# Patient Record
Sex: Male | Born: 1960 | Race: White | Hispanic: No | Smoking: Current every day smoker
Health system: Southern US, Community
[De-identification: ages and names within clinical notes are randomized; demographics above are authoritative.]

## PROBLEM LIST (undated history)

## (undated) HISTORY — PX: APPENDECTOMY: SHX54

---

## 2001-02-07 ENCOUNTER — Emergency Department (HOSPITAL_COMMUNITY): Admission: EM | Admit: 2001-02-07 | Discharge: 2001-02-07 | Payer: Self-pay | Admitting: Emergency Medicine

## 2001-02-07 ENCOUNTER — Encounter: Payer: Self-pay | Admitting: Emergency Medicine

## 2002-04-07 ENCOUNTER — Emergency Department (HOSPITAL_COMMUNITY): Admission: EM | Admit: 2002-04-07 | Discharge: 2002-04-07 | Payer: Self-pay | Admitting: Emergency Medicine

## 2003-07-30 ENCOUNTER — Emergency Department (HOSPITAL_COMMUNITY): Admission: EM | Admit: 2003-07-30 | Discharge: 2003-07-30 | Payer: Self-pay | Admitting: *Deleted

## 2003-10-27 ENCOUNTER — Emergency Department (HOSPITAL_COMMUNITY): Admission: EM | Admit: 2003-10-27 | Discharge: 2003-10-27 | Payer: Self-pay | Admitting: Emergency Medicine

## 2005-04-07 ENCOUNTER — Emergency Department (HOSPITAL_COMMUNITY): Admission: EM | Admit: 2005-04-07 | Discharge: 2005-04-07 | Payer: Self-pay | Admitting: *Deleted

## 2005-07-19 IMAGING — CR DG CHEST 2V
2 series · 2 of 2 positions shown · non-contrast
Comparison: none

CLINICAL DATA: Fell, chest pain.  
 TWO VIEW CHEST
 The heart size and mediastinal contours are unremarkable.  The lungs are clear.  The visualized skeleton is unremarkable.  
 IMPRESSION
 No active disease.

[view not recorded (1 of 2)]
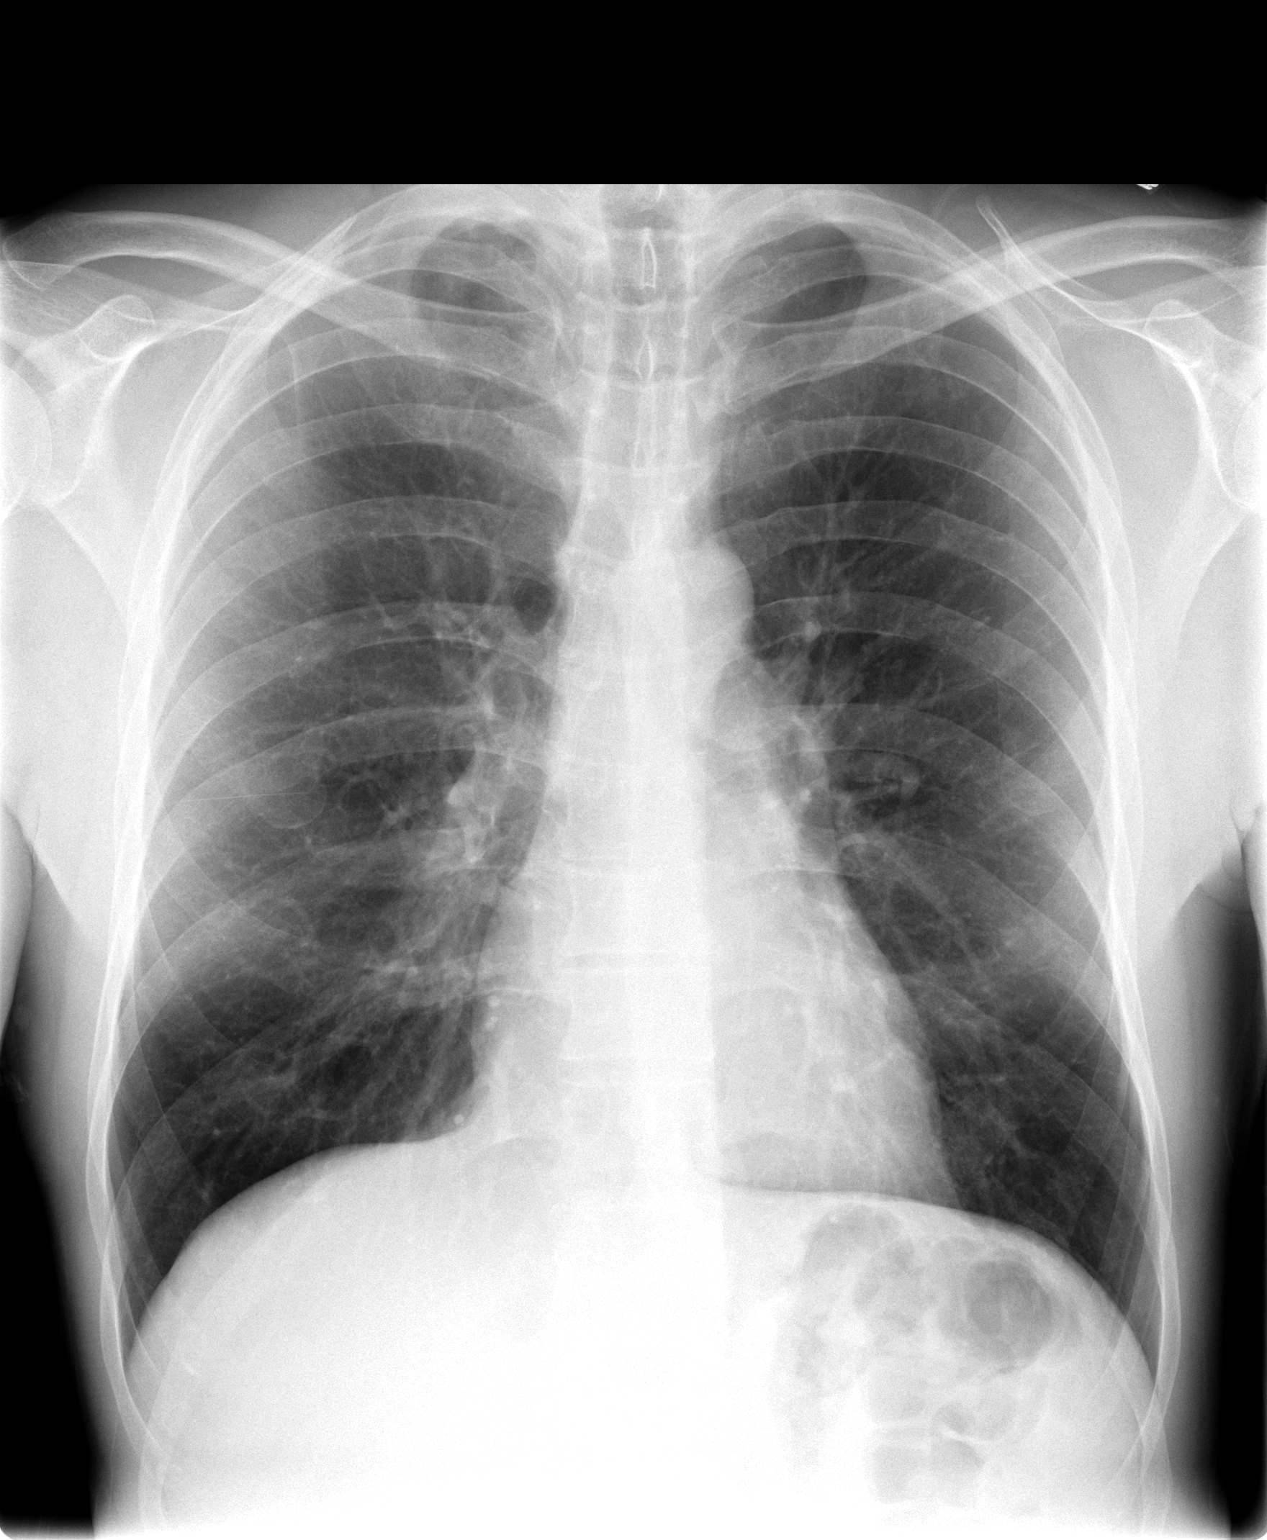

[view not recorded (2 of 2)]
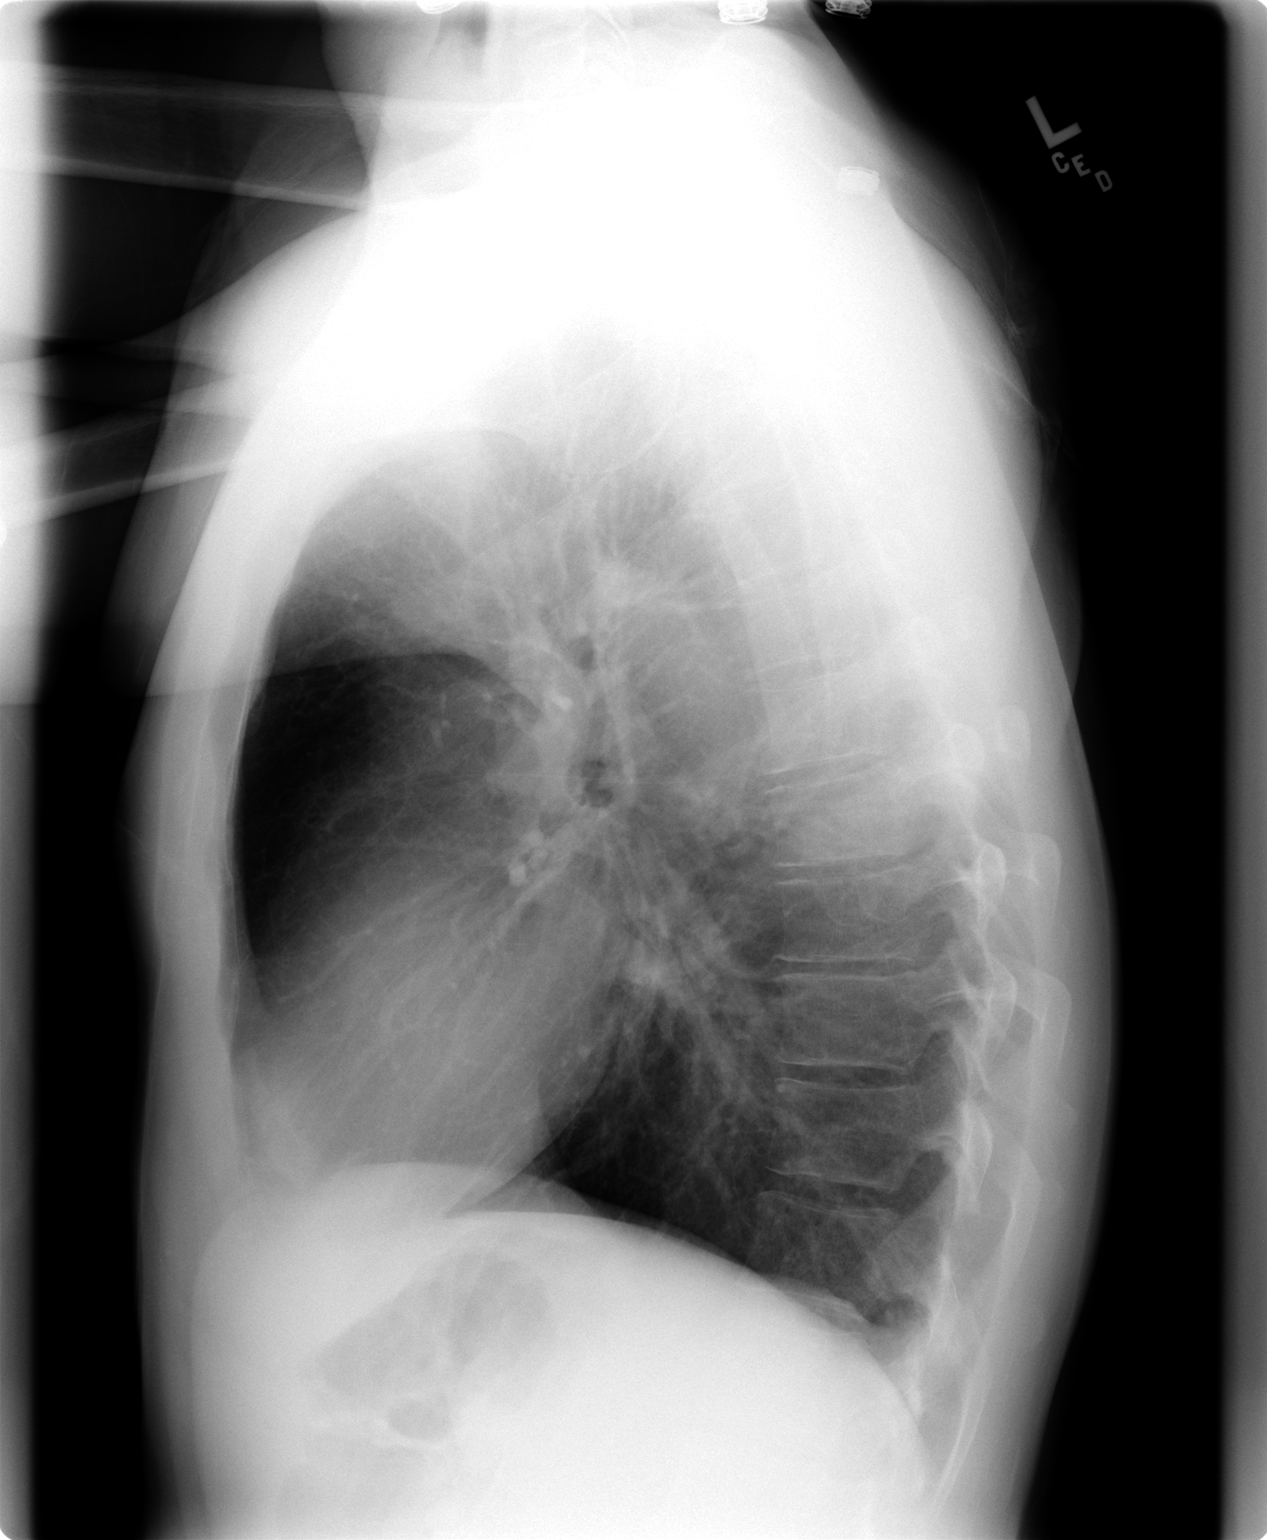

[2 of 2 positions shown; findings below may reference images not displayed]

## 2005-07-30 ENCOUNTER — Emergency Department (HOSPITAL_COMMUNITY): Admission: EM | Admit: 2005-07-30 | Discharge: 2005-07-30 | Payer: Self-pay | Admitting: Emergency Medicine

## 2006-02-24 ENCOUNTER — Emergency Department (HOSPITAL_COMMUNITY): Admission: EM | Admit: 2006-02-24 | Discharge: 2006-02-24 | Payer: Self-pay | Admitting: Emergency Medicine

## 2006-02-27 ENCOUNTER — Emergency Department (HOSPITAL_COMMUNITY): Admission: EM | Admit: 2006-02-27 | Discharge: 2006-02-27 | Payer: Self-pay | Admitting: Emergency Medicine

## 2007-08-17 ENCOUNTER — Encounter (INDEPENDENT_AMBULATORY_CARE_PROVIDER_SITE_OTHER): Payer: Self-pay | Admitting: Otolaryngology

## 2007-08-17 ENCOUNTER — Ambulatory Visit (HOSPITAL_COMMUNITY): Admission: EM | Admit: 2007-08-17 | Discharge: 2007-08-17 | Payer: Self-pay | Admitting: Emergency Medicine

## 2007-12-20 ENCOUNTER — Emergency Department (HOSPITAL_COMMUNITY): Admission: EM | Admit: 2007-12-20 | Discharge: 2007-12-20 | Payer: Self-pay | Admitting: Emergency Medicine

## 2009-09-15 ENCOUNTER — Emergency Department (HOSPITAL_COMMUNITY): Admission: EM | Admit: 2009-09-15 | Discharge: 2009-09-15 | Payer: Self-pay | Admitting: Emergency Medicine

## 2010-06-25 NOTE — Op Note (Signed)
[name], [name]                ACCOUNT NO.:  [id number]   MEDICAL RECORD NO.:  [id number]          PATIENT TYPE:  OBV   LOCATION:  1830                         FACILITY:  [facility]   PHYSICIAN:  Kristine Garbe. Ezzard Standing, M.D.DATE OF BIRTH:  1960-09-26   DATE OF PROCEDURE:  DATE OF DISCHARGE:                               OPERATIVE REPORT   PREOPERATIVE DIAGNOSIS:  Recurrent right peritonsillar abscess.   POSTOPERATIVE DIAGNOSIS:  Recurrent right peritonsillar abscess.   OPERATION:  Tonsillectomy.   SURGEON:  Drema Halon, MD   ANESTHESIA:  General endotracheal.   COMPLICATIONS:  None.   BRIEF CLINICAL NOTE:  Elijah Pena is a 50 year old gentleman who has  had history of tonsil infections in the past.  He has had previous  incision and drainage of the peritonsillar abscess about 2 years ago,  has redeveloped a right peritonsillar abscess.  He has elected to  undergo acute tonsillectomy as this was been planned previously.  He is  taken to operating room this time for tonsillectomy, because of  recurrent right peritonsillar abscess.   DESCRIPTION OF PROCEDURE:  After adequate endotracheal anesthesia, the  patient received 3 grams of Unasyn IV preoperatively.  He also received  15 mg of Decadron.  A mouth gag was used to expose the oropharynx.  The  left tonsil appeared benign.  This was resected from tonsillar fossa  using cautery.  Next, the right tonsil, which was very inflamed with a  right peritonsillar abscess was incised in the right peritonsillar area  and pus was obtained.  The tonsil was then excised from the tonsillar  fossa, exposing the abscess cavity.  Tonsil was sent as a separate  specimen.  Hemostasis was obtained with cautery.  The wound was  irrigated with saline.  After obtaining adequate hemostasis, the  procedure was completed.  There was minimal blood loss.  Elijah Pena was  awoken from anesthesia and transferred to recovery room, postop doing  well.   DISPOSITION:  [name] is discharged home later this morning on amoxicillin  suspension 500 mg b.i.d. for 1 week, Tylenol Lortab elixir 1-1-1/2  tablespoons q.4 h. p.r.n. pain.  We will have him follow up in my office  in 10-14 days for recheck.           ______________________________  Kristine Garbe. Ezzard Standing, M.D.     CEN/MEDQ  D:  08/17/2007  T:  08/18/2007  Job:  [id number]

## 2010-11-07 LAB — CBC
HCT: 40.1
Hemoglobin: 13.8
MCHC: 34.4
RDW: 12.4

## 2010-11-07 LAB — DIFFERENTIAL
Basophils Absolute: 0.1
Basophils Relative: 1
Eosinophils Relative: 3
Monocytes Absolute: 0.9

## 2010-11-07 LAB — BASIC METABOLIC PANEL
BUN: 6
Calcium: 8.6
Chloride: 104
Creatinine, Ser: 0.92
GFR calc Af Amer: >60
GFR calc non Af Amer: >60

## 2010-11-07 LAB — RAPID STREP SCREEN (MED CTR MEBANE ONLY): Streptococcus, Group A Screen (Direct): NEGATIVE

## 2010-11-12 LAB — DIFFERENTIAL
Basophils Absolute: 0
Basophils Relative: 0
Eosinophils Absolute: 0
Neutro Abs: 14.1 — ABNORMAL HIGH
Neutrophils Relative %: 89 — ABNORMAL HIGH

## 2010-11-12 LAB — URINALYSIS, ROUTINE W REFLEX MICROSCOPIC
Hgb urine dipstick: NEGATIVE
Nitrite: NEGATIVE
Protein, ur: NEGATIVE
Specific Gravity, Urine: 1.021
Urobilinogen, UA: 1

## 2010-11-12 LAB — POCT CARDIAC MARKERS
CKMB, poc: <1 — ABNORMAL LOW
Myoglobin, poc: 52

## 2010-11-12 LAB — CBC
Platelets: 218
RDW: 12.9

## 2010-11-12 LAB — COMPREHENSIVE METABOLIC PANEL
ALT: 25
Alkaline Phosphatase: 69
BUN: 9
CO2: 24
GFR calc non Af Amer: >60
Glucose, Bld: 98
Potassium: 4.7
Sodium: 130 — ABNORMAL LOW
Total Bilirubin: 0.8
Total Protein: 6.4

## 2011-08-29 ENCOUNTER — Other Ambulatory Visit: Payer: Self-pay | Admitting: Dermatology

## 2011-12-16 ENCOUNTER — Other Ambulatory Visit: Payer: Self-pay | Admitting: Dermatology

## 2012-01-02 ENCOUNTER — Other Ambulatory Visit: Payer: Self-pay | Admitting: Dermatology

## 2017-03-03 ENCOUNTER — Encounter: Payer: Self-pay | Admitting: Urgent Care

## 2017-03-03 ENCOUNTER — Ambulatory Visit: Payer: 59 | Admitting: Urgent Care

## 2017-03-03 VITALS — BP 125/72 | HR 77 | Temp 98.1°F | Resp 18 | Ht 66.5 in | Wt 132.0 lb

## 2017-03-03 DIAGNOSIS — H6123 Impacted cerumen, bilateral: Secondary | ICD-10-CM | POA: Diagnosis not present

## 2017-03-03 DIAGNOSIS — F17200 Nicotine dependence, unspecified, uncomplicated: Secondary | ICD-10-CM | POA: Diagnosis not present

## 2017-03-03 MED ORDER — NICOTINE POLACRILEX 2 MG MT GUM: CHEWING_GUM | OROMUCOSAL | 5 refills | Status: DC

## 2017-03-03 NOTE — Patient Instructions (Addendum)
Earwax Buildup, Adult The ears produce a substance called earwax that helps keep bacteria out of the ear and protects the skin in the ear canal. Occasionally, earwax can build up in the ear and cause discomfort or hearing loss. What increases the risk? This condition is more likely to develop in people who:  Are male.  Are elderly.  Naturally produce more earwax.  Clean their ears often with cotton swabs.  Use earplugs often.  Use in-ear headphones often.  Wear hearing aids.  Have narrow ear canals.  Have earwax that is overly thick or sticky.  Have eczema.  Are dehydrated.  Have excess hair in the ear canal.  What are the signs or symptoms? Symptoms of this condition include:  Reduced or muffled hearing.  A feeling of fullness in the ear or feeling that the ear is plugged.  Fluid coming from the ear.  Ear pain.  Ear itch.  Ringing in the ear.  Coughing.  An obvious piece of earwax that can be seen inside the ear canal.  How is this diagnosed? This condition may be diagnosed based on:  Your symptoms.  Your medical history.  An ear exam. During the exam, your health care provider will look into your ear with an instrument called an otoscope.  You may have tests, including a hearing test. How is this treated? This condition may be treated by:  Using ear drops to soften the earwax.  Having the earwax removed by a health care provider. The health care provider may: ? Flush the ear with water. ? Use an instrument that has a loop on the end (curette). ? Use a suction device.  Surgery to remove the wax buildup. This may be done in severe cases.  Follow these instructions at home:  Take over-the-counter and prescription medicines only as told by your health care provider.  Do not put any objects, including cotton swabs, into your ear. You can clean the opening of your ear canal with a washcloth or facial tissue.  Follow instructions from your health  care provider about cleaning your ears. Do not over-clean your ears.  Drink enough fluid to keep your urine clear or pale yellow. This will help to thin the earwax.  Keep all follow-up visits as told by your health care provider. If earwax builds up in your ears often or if you use hearing aids, consider seeing your health care provider for routine, preventive ear cleanings. Ask your health care provider how often you should schedule your cleanings.  If you have hearing aids, clean them according to instructions from the manufacturer and your health care provider. Contact a health care provider if:  You have ear pain.  You develop a fever.  You have blood, pus, or other fluid coming from your ear.  You have hearing loss.  You have ringing in your ears that does not go away.  Your symptoms do not improve with treatment.  You feel like the room is spinning (vertigo). Summary  Earwax can build up in the ear and cause discomfort or hearing loss.  The most common symptoms of this condition include reduced or muffled hearing and a feeling of fullness in the ear or feeling that the ear is plugged.  This condition may be diagnosed based on your symptoms, your medical history, and an ear exam.  This condition may be treated by using ear drops to soften the earwax or by having the earwax removed by a health care provider.  Do   not put any objects, including cotton swabs, into your ear. You can clean the opening of your ear canal with a washcloth or facial tissue. This information is not intended to replace advice given to you by your health care provider. Make sure you discuss any questions you have with your health care provider. Document Released: 03/06/2004 Document Revised: 04/09/2016 Document Reviewed: 04/09/2016 Elsevier Interactive Patient Education  2018 [employer].     Use up to 24 pieces of gum per day for six weeks. Gradually reduce use over a second six weeks, for a total  duration of three months.     Nicotine chewing gum What is this medicine? NICOTINE (NIK [place] teen) helps people stop smoking. This medicine replaces the nicotine found in cigarettes and helps to decrease withdrawal effects. It is most effective when used in combination with a stop-smoking program. This medicine may be used for other purposes; ask your health care provider or pharmacist if you have questions. COMMON BRAND NAME(S): NICOrelief, Nicorette What should I tell my health care provider before I take this medicine? They need to know if you have any of these conditions: -diabetes -heart disease, angina, irregular heartbeat or previous heart attack -high blood pressure -lung disease, including asthma -overactive thyroid -pheochromocytoma -seizures or history of seizures -stomach problems or ulcers -an unusual or allergic reaction to nicotine, other medicines, foods, dyes, or preservatives -pregnant or trying to get pregnant -breast-feeding How should I use this medicine? Chew but do not swallow the gum. Follow the directions that come with the chewing gum. Use exactly as directed. When you feel an urgent desire for a cigarette, chew one piece of gum slowly. Continue chewing until you taste the gum or feel a slight tingling in your mouth. Then, stop chewing and place the gum between your cheek and gum. Wait until the taste or tingling is almost gone then start chewing again. Continue chewing in this manner for about 30 minutes. Slow chewing helps reduce cravings and also helps reduce the chance for heartburn or other gastrointestinal side effects. Talk to your pediatrician regarding the use of this medicine in children. Special care may be needed. Overdosage: If you think you have taken too much of this medicine contact a poison control center or emergency room at once. NOTE: This medicine is only for you. Do not share this medicine with others. What if I miss a dose? This does not  apply. Only use the chewing gum when you have a strong desire to smoke. Do not use more than one piece of gum at a time. What may interact with this medicine? -medicines for asthma -medicines for blood pressure -medicines for mental depression This list may not describe all possible interactions. Give your health care provider a list of all the medicines, herbs, non-prescription drugs, or dietary supplements you use. Also tell them if you smoke, drink alcohol, or use illegal drugs. Some items may interact with your medicine. What should I watch for while using this medicine? Always carry the nicotine gum with you. Do not use more than 30 pieces of gum a day. Too much gum can increase the risk of an overdose. As the urge to smoke gets less, gradually reduce the number of pieces each day over a period of 2 to 3 months. When you are only using 1 or 2 pieces a day, stop using the nicotine gum. You should begin using the nicotine gum the day you stop smoking. It is okay if you do not  succeed with the attempt to quit and have a cigarette. You can still continue your quit attempt and keep using the product as directed. Just throw away your cigarettes and get back to your quit plan. If your mouth gets sore from chewing the gum, suck hard sugarless candy between pieces of gum to help relieve the soreness. Brush your teeth regularly to reduce mouth irritation. If you wear dentures, contact your doctor or health care professional if the gum sticks to your dental work. If you are a diabetic and you quit smoking, the effects of insulin may be increased and you may need to reduce your insulin dose. Check with your doctor or health care professional about how you should adjust your insulin dose. What side effects may I notice from receiving this medicine? Side effects that you should report to your doctor or health care professional as soon as possible: -allergic reactions like skin rash, itching or hives, swelling of  the face, lips, or tongue -blisters in mouth -breathing problems -changes in hearing -changes in vision -chest pain -cold sweats -confusion -fast, irregular heartbeat -feeling faint or lightheaded, falls -headache -increased saliva -nausea, vomiting -stomach pain -weakness Side effects that usually do not require medical attention (report to your doctor or health care professional if they continue or are bothersome): -diarrhea -dry mouth -hiccups -irritability -nervousness or restlessness -trouble sleeping or vivid dreams This list may not describe all possible side effects. Call your doctor for medical advice about side effects. You may report side effects to FDA at 1-800-FDA-1088. Where should I keep my medicine? Keep out of the reach of children. Store at room temperature between 15 and 30 degrees C (59 and 86 degrees F). Protect from heat and light. Throw away unused medicine after the expiration date. NOTE: This sheet is a summary. It may not cover all possible information. If you have questions about this medicine, talk to your doctor, pharmacist, or health care provider.  2018 Elsevier/Gold Standard (2014-07-24 19:37:14)     Steps to Quit Smoking Smoking tobacco can be harmful to your health and can affect almost every organ in your body. Smoking puts you, and those around you, at risk for developing many serious chronic diseases. Quitting smoking is difficult, but it is one of the best things that you can do for your health. It is never too late to quit. What are the benefits of quitting smoking? When you quit smoking, you lower your risk of developing serious diseases and conditions, such as:  Lung cancer or lung disease, such as COPD.  Heart disease.  Stroke.  Heart attack.  Infertility.  Osteoporosis and bone fractures.  Additionally, symptoms such as coughing, wheezing, and shortness of breath may get better when you quit. You may also find that you get  sick less often because your body is stronger at fighting off colds and infections. If you are pregnant, quitting smoking can help to reduce your chances of having a baby of low birth weight. How do I get ready to quit? When you decide to quit smoking, create a plan to make sure that you are successful. Before you quit:  Pick a date to quit. Set a date within the next two weeks to give you time to prepare.  Write down the reasons why you are quitting. Keep this list in places where you will see it often, such as on your bathroom mirror or in your car or wallet.  Identify the people, places, things, and activities that make  you want to smoke (triggers) and avoid them. Make sure to take these actions: ? Throw away all cigarettes at home, at work, and in your car. ? Throw away smoking accessories, such as Set designer. ? Clean your car and make sure to empty the ashtray. ? Clean your home, including curtains and carpets.  Tell your family, friends, and coworkers that you are quitting. Support from your loved ones can make quitting easier.  Talk with your health care provider about your options for quitting smoking.  Find out what treatment options are covered by your health insurance.  What strategies can I use to quit smoking? Talk with your healthcare provider about different strategies to quit smoking. Some strategies include:  Quitting smoking altogether instead of gradually lessening how much you smoke over a period of time. Research shows that quitting "cold [place]" is more successful than gradually quitting.  Attending in-person counseling to help you build problem-solving skills. You are more likely to have success in quitting if you attend several counseling sessions. Even short sessions of 10 minutes can be effective.  Finding resources and support systems that can help you to quit smoking and remain smoke-free after you quit. These resources are most helpful when you use  them often. They can include: ? Online chats with a Veterinary surgeon. ? Telephone quitlines. ? Automotive engineer. ? Support groups or group counseling. ? Text messaging programs. ? Mobile phone applications.  Taking medicines to help you quit smoking. (If you are pregnant or breastfeeding, talk with your health care provider first.) Some medicines contain nicotine and some do not. Both types of medicines help with cravings, but the medicines that include nicotine help to relieve withdrawal symptoms. Your health care provider may recommend: ? Nicotine patches, gum, or lozenges. ? Nicotine inhalers or sprays. ? Non-nicotine medicine that is taken by mouth.  Talk with your health care provider about combining strategies, such as taking medicines while you are also receiving in-person counseling. Using these two strategies together makes you more likely to succeed in quitting than if you used either strategy on its own. If you are pregnant or breastfeeding, talk with your health care provider about finding counseling or other support strategies to quit smoking. Do not take medicine to help you quit smoking unless told to do so by your health care provider. What things can I do to make it easier to quit? Quitting smoking might feel overwhelming at first, but there is a lot that you can do to make it easier. Take these important actions:  Reach out to your family and friends and ask that they support and encourage you during this time. Call telephone quitlines, reach out to support groups, or work with a counselor for support.  Ask people who smoke to avoid smoking around you.  Avoid places that trigger you to smoke, such as bars, parties, or smoke-break areas at work.  Spend time around people who do not smoke.  Lessen stress in your life, because stress can be a smoking trigger for some people. To lessen stress, try: ? Exercising regularly. ? Deep-breathing  exercises. ? Yoga. ? Meditating. ? Performing a body scan. This involves closing your eyes, scanning your body from head to toe, and noticing which parts of your body are particularly tense. Purposefully relax the muscles in those areas.  Download or purchase mobile phone or tablet apps (applications) that can help you stick to your quit plan by providing reminders, tips, and encouragement. There are  many free apps, such as QuitGuide from the [employer] developer for Disease Control and Prevention). You can find other support for quitting smoking (smoking cessation) through smokefree.gov and other websites.  How will I feel when I quit smoking? Within the first 24 hours of quitting smoking, you may start to feel some withdrawal symptoms. These symptoms are usually most noticeable 2-3 days after quitting, but they usually do not last beyond 2-3 weeks. Changes or symptoms that you might experience include:  Mood swings.  Restlessness, anxiety, or irritation.  Difficulty concentrating.  Dizziness.  Strong cravings for sugary foods in addition to nicotine.  Mild weight gain.  Constipation.  Nausea.  Coughing or a sore throat.  Changes in how your medicines work in your body.  A depressed mood.  Difficulty sleeping (insomnia).  After the first 2-3 weeks of quitting, you may start to notice more positive results, such as:  Improved sense of smell and taste.  Decreased coughing and sore throat.  Slower heart rate.  Lower blood pressure.  Clearer skin.  The ability to breathe more easily.  Fewer sick days.  Quitting smoking is very challenging for most people. Do not get discouraged if you are not successful the first time. Some people need to make many attempts to quit before they achieve long-term success. Do your best to stick to your quit plan, and talk with your health care provider if you have any questions or concerns. This information is not intended to replace advice given  to you by your health care provider. Make sure you discuss any questions you have with your health care provider. Document Released: 01/21/2001 Document Revised: 09/25/2015 Document Reviewed: 06/13/2014 Elsevier Interactive Patient Education  2018 [employer].     IF you received an x-ray today, you will receive an invoice from [facility]. Please contact [facility] at [phone] with questions or concerns regarding your invoice.   IF you received labwork today, you will receive an invoice from [facility]. Please contact [facility] at [phone] with questions or concerns regarding your invoice.   Our billing staff will not be able to assist you with questions regarding bills from these companies.  You will be contacted with the lab results as soon as they are available. The fastest way to get your results is to activate your My Chart account. Instructions are located on the last page of this paperwork. If you have not heard from [place] regarding the results in 2 weeks, please contact this office.

## 2017-03-03 NOTE — Progress Notes (Signed)
  MRN: [id number] DOB: July 29, 1960  Subjective:   Elijah ClayDavid E Pena is a 57 y.o. male new to our practice presenting for longstanding decreased hearing and ear wax build up. Also has tinnitus. He was at [facility] today for a hearing screen and advised to set up an office visit for us due to having ear wax build up. Denies fever, ear drainage, ear pain, dizziness. Smokes 1 ppd, has tried to quit before, had an allergic reaction to Wellbutrin and had to be hospitalized. Has also tried patches before but got a rash with this too.   Elijah Pena is not currently taking any medications and is allergic to wellbutrin [bupropion].  Elijah Pena denies past medical history. Also  has a past surgical history that includes Appendectomy. Denies family history of cancer, diabetes, HTN, HL, heart disease, stroke, mental illness.    Objective:   Vitals: BP 125/72   Pulse 77   Temp 98.1 F (36.7 C) (Oral)   Resp 18   Ht 5' 6.5" (1.689 m)   Wt 132 lb (59.9 kg)   SpO2 96%   BMI 20.99 kg/m   Physical Exam  Constitutional: He is oriented to person, place, and time. He appears well-developed and well-nourished.  HENT:  Bilateral cerumen impaction.  Cardiovascular: Normal rate.  Pulmonary/Chest: Effort normal.  Neurological: He is alert and oriented to person, place, and time.  Psychiatric: He has a normal mood and affect.   Assessment and Plan :   Bilateral impacted cerumen - Plan: Ear wax removal  Tobacco use disorder  Ear lavage performed. Smoking cessation discussed. Will start Nicorette gum. Counseled patient on potential for adverse effects with medications prescribed today, patient verbalized understanding. Recheck in 6 weeks.  Wallis BambergMario Tran Randle, PA-C Primary Care at [facility] [phone] 03/03/2017  4:00 PM

## 2017-03-17 ENCOUNTER — Ambulatory Visit: Payer: 59 | Admitting: Urgent Care

## 2017-03-17 ENCOUNTER — Encounter: Payer: Self-pay | Admitting: Urgent Care

## 2017-03-17 VITALS — BP 124/80 | HR 86 | Temp 98.6°F | Resp 17 | Ht 67.5 in | Wt 133.0 lb

## 2017-03-17 DIAGNOSIS — H9193 Unspecified hearing loss, bilateral: Secondary | ICD-10-CM

## 2017-03-17 NOTE — Progress Notes (Signed)
  MRN: [id number] DOB: 05/01/1960  Subjective:   Donata ClayDavid E Ashe is a 57 y.o. male presenting for a prescription for hearing aids. Patient was seen by [facility], had audiometry testing and was found to have hearing loss, left worse than right. He was not able to get his hearing aids given that his insurance provider denied the claim as he needs a script from an ENT.   Onalee HuaDavid has a current medication list which includes the following prescription(s): nicotine polacrilex. Also is allergic to wellbutrin [bupropion] and nicoderm [nicotine].  Onalee HuaDavid  has no past medical history on file. Also  has a past surgical history that includes Appendectomy.  Objective:   Vitals: BP 124/80   Pulse 86   Temp 98.6 F (37 C) (Oral)   Resp 17   Ht 5' 7.5" (1.715 m)   Wt 133 lb (60.3 kg)   SpO2 98%   BMI 20.52 kg/m   Physical Exam  Constitutional: He is oriented to person, place, and time. He appears well-developed and well-nourished.  Cardiovascular: Normal rate.  Pulmonary/Chest: Effort normal.  Neurological: He is alert and oriented to person, place, and time.  Psychiatric: He has a normal mood and affect.   Assessment and Plan :   Will refer for the soonest available appointment with an ENT physician not advanced practice practitioner. Patient needs a signed script from a specialist for a hearing aid for insurance approval, has already had his audiometry testing.  Wallis BambergMario Jehad Bisono, PA-C Primary Care at [facility] [phone] 03/17/2017  3:48 PM

## 2017-03-17 NOTE — Patient Instructions (Addendum)
Hearing Loss Hearing loss is a partial or total loss of the ability to hear. This can be temporary or permanent, and it can happen in one or both ears. Hearing loss may be referred to as deafness. Medical care is necessary to treat hearing loss properly and to prevent the condition from getting worse. Your hearing may partially or completely come back, depending on what caused your hearing loss and how severe it is. In some cases, hearing loss is permanent. What are the causes? Common causes of hearing loss include:  Too much wax in the ear canal.  Infection of the ear canal or middle ear.  Fluid in the middle ear.  Injury to the ear or surrounding area.  An object stuck in the ear.  Prolonged exposure to loud sounds, such as music.  Less common causes of hearing loss include:  Tumors in the ear.  Viral or bacterial infections, such as meningitis.  A hole in the eardrum (perforated eardrum).  Problems with the hearing nerve that sends signals between the brain and the ear.  Certain medicines.  What are the signs or symptoms? Symptoms of this condition may include:  Difficulty telling the difference between sounds.  Difficulty following a conversation when there is background noise.  Lack of response to sounds in your environment. This may be most noticeable when you do not respond to startling sounds.  Needing to turn up the volume on the television, radio, etc.  Ringing in the ears.  Dizziness.  Pain in the ears.  How is this diagnosed? This condition is diagnosed based on a physical exam and a hearing test (audiometry). The audiometry test will be performed by a hearing specialist (audiologist). You may also be referred to an ear, nose, and throat (ENT) specialist (otolaryngologist). How is this treated? Treatment for recent onset of hearing loss may include:  Ear wax removal.  Being prescribed medicines to prevent infection (antibiotics).  Being prescribed  medicines to reduce inflammation (corticosteroids).  Follow these instructions at home:  If you were prescribed an antibiotic medicine, take it as told by your health care provider. Do not stop taking the antibiotic even if you start to feel better.  Take over-the-counter and prescription medicines only as told by your health care provider.  Avoid loud noises.  Return to your normal activities as told by your health care provider. Ask your health care provider what activities are safe for you.  Keep all follow-up visits as told by your health care provider. This is important. Contact a health care provider if:  You feel dizzy.  You develop new symptoms.  You vomit or feel nauseous.  You have a fever. Get help right away if:  You develop sudden changes in your vision.  You have severe ear pain.  You have new or increased weakness.  You have a severe headache. This information is not intended to replace advice given to you by your health care provider. Make sure you discuss any questions you have with your health care provider. Document Released: 01/27/2005 Document Revised: 07/05/2015 Document Reviewed: 06/14/2014 Elsevier Interactive Patient Education  2018 [employer].     IF you received an x-ray today, you will receive an invoice from [facility]. Please contact [facility] at [phone] with questions or concerns regarding your invoice.   IF you received labwork today, you will receive an invoice from [facility]. Please contact [facility] at [phone] with questions or concerns regarding your invoice.   Our billing staff will  not be able to assist you with questions regarding bills from these companies.  You will be contacted with the lab results as soon as they are available. The fastest way to get your results is to activate your My Chart account. Instructions are located on the last page of this paperwork. If you have not heard from us regarding  the results in 2 weeks, please contact this office.

## 2017-04-02 DIAGNOSIS — H903 Sensorineural hearing loss, bilateral: Secondary | ICD-10-CM | POA: Diagnosis not present

## 2017-05-07 DIAGNOSIS — H918X2 Other specified hearing loss, left ear: Secondary | ICD-10-CM | POA: Diagnosis not present

## 2017-05-07 DIAGNOSIS — H9313 Tinnitus, bilateral: Secondary | ICD-10-CM | POA: Diagnosis not present

## 2017-05-07 DIAGNOSIS — H903 Sensorineural hearing loss, bilateral: Secondary | ICD-10-CM | POA: Diagnosis not present

## 2018-02-08 DIAGNOSIS — H40023 Open angle with borderline findings, high risk, bilateral: Secondary | ICD-10-CM | POA: Diagnosis not present

## 2018-02-08 DIAGNOSIS — H25013 Cortical age-related cataract, bilateral: Secondary | ICD-10-CM | POA: Diagnosis not present

## 2018-02-08 DIAGNOSIS — H40033 Anatomical narrow angle, bilateral: Secondary | ICD-10-CM | POA: Diagnosis not present

## 2022-12-23 ENCOUNTER — Other Ambulatory Visit: Payer: Self-pay

## 2022-12-23 ENCOUNTER — Emergency Department (HOSPITAL_COMMUNITY)
Admission: EM | Admit: 2022-12-23 | Discharge: 2022-12-23 | Disposition: A | Discharge status: Home / Self Care | Payer: BLUE CROSS/BLUE SHIELD | Attending: Emergency Medicine | Admitting: Emergency Medicine

## 2022-12-23 ENCOUNTER — Emergency Department (HOSPITAL_COMMUNITY): Payer: BLUE CROSS/BLUE SHIELD

## 2022-12-23 ENCOUNTER — Encounter (HOSPITAL_COMMUNITY): Payer: Self-pay | Admitting: Emergency Medicine

## 2022-12-23 DIAGNOSIS — Z1152 Encounter for screening for COVID-19: Secondary | ICD-10-CM | POA: Insufficient documentation

## 2022-12-23 DIAGNOSIS — F17210 Nicotine dependence, cigarettes, uncomplicated: Secondary | ICD-10-CM | POA: Diagnosis not present

## 2022-12-23 DIAGNOSIS — J9801 Acute bronchospasm: Secondary | ICD-10-CM | POA: Insufficient documentation

## 2022-12-23 DIAGNOSIS — D72829 Elevated white blood cell count, unspecified: Secondary | ICD-10-CM | POA: Diagnosis not present

## 2022-12-23 DIAGNOSIS — R0602 Shortness of breath: Secondary | ICD-10-CM | POA: Diagnosis present

## 2022-12-23 LAB — CBC WITH DIFFERENTIAL/PLATELET
Abs Immature Granulocytes: 0.05 10*3/uL (ref 0.00–0.07)
Basophils Absolute: 0 10*3/uL (ref 0.0–0.1)
Basophils Relative: 0 %
Eosinophils Absolute: 0.3 10*3/uL (ref 0.0–0.5)
Eosinophils Relative: 3 %
HCT: 38.8 % — ABNORMAL LOW (ref 39.0–52.0)
Hemoglobin: 13.1 g/dL (ref 13.0–17.0)
Immature Granulocytes: 0 %
Lymphocytes Relative: 10 %
Lymphs Abs: 1.1 10*3/uL (ref 0.7–4.0)
MCH: 29.6 pg (ref 26.0–34.0)
MCHC: 33.8 g/dL (ref 30.0–36.0)
MCV: 87.8 fL (ref 80.0–100.0)
Monocytes Absolute: 0.7 10*3/uL (ref 0.1–1.0)
Monocytes Relative: 6 %
Neutro Abs: 9.4 10*3/uL — ABNORMAL HIGH (ref 1.7–7.7)
Neutrophils Relative %: 81 %
Platelets: 306 10*3/uL (ref 150–400)
RBC: 4.42 MIL/uL (ref 4.22–5.81)
RDW: 11.9 % (ref 11.5–15.5)
WBC: 11.6 10*3/uL — ABNORMAL HIGH (ref 4.0–10.5)
nRBC: 0 % (ref 0.0–0.2)

## 2022-12-23 LAB — BASIC METABOLIC PANEL
Anion gap: 8 (ref 5–15)
BUN: 10 mg/dL (ref 8–23)
CO2: 24 mmol/L (ref 22–32)
Calcium: 9.3 mg/dL (ref 8.9–10.3)
Chloride: 104 mmol/L (ref 98–111)
Creatinine, Ser: 0.92 mg/dL (ref 0.61–1.24)
GFR, Estimated: >60 mL/min (ref >60)
Glucose, Bld: 134 mg/dL — ABNORMAL HIGH (ref 70–99)
Potassium: 4.5 mmol/L (ref 3.5–5.1)
Sodium: 136 mmol/L (ref 135–145)

## 2022-12-23 LAB — RESP PANEL BY RT-PCR (RSV, FLU A&B, COVID)  RVPGX2
Influenza A by PCR: NEGATIVE
Influenza B by PCR: NEGATIVE
Resp Syncytial Virus by PCR: NEGATIVE
SARS Coronavirus 2 by RT PCR: NEGATIVE

## 2022-12-23 LAB — TROPONIN I (HIGH SENSITIVITY): Troponin I (High Sensitivity): 5 ng/L (ref <18)

## 2022-12-23 MED ORDER — IPRATROPIUM-ALBUTEROL 0.5-2.5 (3) MG/3ML IN SOLN
3.0000 mL | Freq: Once | RESPIRATORY_TRACT | Status: DC
  Filled 2022-12-23: qty 3

## 2022-12-23 MED ORDER — ALBUTEROL SULFATE (2.5 MG/3ML) 0.083% IN NEBU
10.0000 mg/h | INHALATION_SOLUTION | RESPIRATORY_TRACT | Status: AC
  Administered 2022-12-23: 10 mg/h via RESPIRATORY_TRACT
  Filled 2022-12-23: qty 12

## 2022-12-23 MED ORDER — PREDNISONE 20 MG PO TABS: 40.0000 mg | ORAL_TABLET | Freq: Every day | ORAL | 0 refills | Status: DC

## 2022-12-23 MED ORDER — IPRATROPIUM-ALBUTEROL 0.5-2.5 (3) MG/3ML IN SOLN
3.0000 mL | Freq: Once | RESPIRATORY_TRACT | Status: AC
  Administered 2022-12-23: 3 mL via RESPIRATORY_TRACT

## 2022-12-23 MED ORDER — NICOTINE 21 MG/24HR TD PT24: 21.0000 mg | MEDICATED_PATCH | Freq: Every day | TRANSDERMAL | 0 refills | Status: DC

## 2022-12-23 MED ORDER — ALBUTEROL SULFATE HFA 108 (90 BASE) MCG/ACT IN AERS: 2.0000 | INHALATION_SPRAY | RESPIRATORY_TRACT | 3 refills | Status: DC | PRN

## 2022-12-23 MED ORDER — ALBUTEROL SULFATE HFA 108 (90 BASE) MCG/ACT IN AERS
2.0000 | INHALATION_SPRAY | RESPIRATORY_TRACT | Status: DC | PRN
  Administered 2022-12-23: 2 via RESPIRATORY_TRACT
  Filled 2022-12-23: qty 6.7

## 2022-12-23 NOTE — ED Triage Notes (Signed)
Pt in with worsening cough, chills and sob x 2 wks. Sats 87% when placed in triage, 2LNC applied. Reporting pain to back and ribs, expiratory wheezes noted in triage

## 2022-12-23 NOTE — Discharge Instructions (Signed)
Use 2 puffs of the inhaler every 4 hours for the next 2 days.  Please follow-up with the clinic listed.  Return for new or worsening symptoms.

## 2022-12-23 NOTE — ED Provider Notes (Signed)
[facility] Emergency Department Provider Note MRN:  [id number]  Arrival date & time: 12/23/22     Chief Complaint   Shortness of Breath and Cough   History of Present Illness   Elijah Pena is a 62 y.o. year-old male presents to the ED with chief complaint of shortness of breath and wheezing.  He states that the symptoms acutely worsened tonight.  He is an everyday smoker.  He denies ever having been diagnosed with COPD or asthma.  He denies any fever or chills.  He states that he has chronic cough.  Denies any successful treatments prior to arrival.  He did try getting an over-the-counter inhaler, that did not help.Marland Kitchen  History provided by patient.   Review of Systems  Pertinent positive and negative review of systems noted in HPI.    Physical Exam   Vitals:   12/23/22 0530 12/23/22 0612  BP: 123/62   Pulse: (!) 106   Resp: 15   Temp:  99.5 F (37.5 C)  SpO2: 92%     CONSTITUTIONAL:  non toxic-appearing, NAD NEURO:  Alert and oriented x 3, CN 3-12 grossly intact EYES:  eyes equal and reactive ENT/NECK:  Supple, no stridor  CARDIO:  normal rate, regular rhythm, appears well-perfused  PULM:  mildly increased WOB, diffuse wheezing GI/GU:  non-distended,  MSK/SPINE:  No gross deformities, no edema, moves all extremities  SKIN:  no rash, atraumatic   *Additional and/or pertinent findings included in MDM below  Diagnostic and Interventional Summary    EKG Interpretation Date/Time:    Ventricular Rate:    PR Interval:    QRS Duration:    QT Interval:    QTC Calculation:   R Axis:      Text Interpretation:         Labs Reviewed  CBC WITH DIFFERENTIAL/PLATELET - Abnormal; Notable for the following components:      Result Value   WBC 11.6 (*)    HCT 38.8 (*)    Neutro Abs 9.4 (*)    All other components within normal limits  BASIC METABOLIC PANEL - Abnormal; Notable for the following components:   Glucose, Bld 134 (*)    All other  components within normal limits  RESP PANEL BY RT-PCR (RSV, FLU A&B, COVID)  [id number]  TROPONIN I (HIGH SENSITIVITY)  TROPONIN I (HIGH SENSITIVITY)    DG Chest Portable 1 View  Final Result      Medications  ipratropium-albuterol (DUONEB) 0.5-2.5 (3) MG/3ML nebulizer solution 3 mL (has no administration in time range)  albuterol (PROVENTIL) (2.5 MG/3ML) 0.083% nebulizer solution (0 mg/hr Nebulization Stopped 12/23/22 0614)  albuterol (VENTOLIN HFA) 108 (90 Base) MCG/ACT inhaler 2 puff (2 puffs Inhalation Given 12/23/22 0613)  ipratropium-albuterol (DUONEB) 0.5-2.5 (3) MG/3ML nebulizer solution 3 mL (3 mLs Nebulization Given 12/23/22 0221)     Procedures  /  Critical Care Procedures  ED Course and Medical Decision Making  I have reviewed the triage vital signs, the nursing notes, and pertinent available records from the EMR.  Social Determinants Affecting Complexity of Care: Patient has no clinically significant social determinants affecting this chief complaint..   ED Course: Clinical Course as of 12/23/22 0625  Tue Dec 23, 2022  0624 CBC with Differential(!) Mild leukocytosis, chest x-ray ordered to evaluate for pneumonia [RB]  0624 Basic metabolic panel(!) No significant electrolyte abnormality [RB]  0624 Resp panel by RT-PCR (RSV, Flu A&B, Covid) Anterior Nasal Swab Normal [RB]  0625 DG Chest  Portable 1 View No obvious pneumonia [RB]    Clinical Course User Index [RB] Roxy Horseman, PA-C    Medical Decision Making Patient here with shortness of breath and wheezing.  He has diffuse wheezing on exam.  Will give nebulizer and steroid.  Patient is an everyday smoker.  He does not have a primary care doctor.  No history of COPD or asthma.  Patient reports improvement after first breathing treatment.  Patient reassessed, still having some wheezing.  Will give continuous albuterol treatment.  Patient reassessed after continuous albuterol treatment, he is breathing much  better.  He states that he feels ready to go home.  His oxygen level is greater than 90% on room air.  He is able to ambulate well.  I feel that he is stable for discharge and outpatient follow-up.  Return precautions discussed.  Patient states that he would like to stop smoking, and asks if I will prescribe him some nicotine patches to help him get started.  Amount and/or Complexity of Data Reviewed Labs: ordered. Decision-making details documented in ED Course. Radiology: ordered and independent interpretation performed. ECG/medicine tests: ordered.  Risk OTC drugs. Prescription drug management.         Consultants: No consultations were needed in caring for this patient.   Treatment and Plan: I considered admission due to patient's initial presentation, but after considering the examination and diagnostic results, patient will not require admission and can be discharged with outpatient follow-up.    Final Clinical Impressions(s) / ED Diagnoses     ICD-10-CM   1. Bronchospasm  J98.01       ED Discharge Orders          Ordered    nicotine (NICODERM CQ - DOSED IN MG/24 HOURS) 21 mg/24hr patch  Daily        12/23/22 0553    albuterol (VENTOLIN HFA) 108 (90 Base) MCG/ACT inhaler  Every 4 hours PRN        12/23/22 0553    predniSONE (DELTASONE) 20 MG tablet  Daily        12/23/22 0553              Discharge Instructions Discussed with and Provided to Patient:     Discharge Instructions      Use 2 puffs of the inhaler every 4 hours for the next 2 days.  Please follow-up with the clinic listed.  Return for new or worsening symptoms.       Roxy Horseman, PA-C 12/23/22 6962    Marily Memos, MD 12/24/22 0000

## 2023-01-01 ENCOUNTER — Ambulatory Visit: Payer: BLUE CROSS/BLUE SHIELD | Admitting: Internal Medicine

## 2023-01-22 ENCOUNTER — Ambulatory Visit: Payer: BLUE CROSS/BLUE SHIELD | Admitting: Internal Medicine

## 2023-02-26 ENCOUNTER — Other Ambulatory Visit: Payer: Self-pay | Admitting: Orthopaedic Surgery

## 2023-02-26 DIAGNOSIS — M5412 Radiculopathy, cervical region: Secondary | ICD-10-CM

## 2023-02-26 DIAGNOSIS — M542 Cervicalgia: Secondary | ICD-10-CM

## 2023-02-27 ENCOUNTER — Inpatient Hospital Stay (HOSPITAL_COMMUNITY)
Admission: EM | Admit: 2023-02-27 | Discharge: 2023-03-03 | DRG: 541 | Disposition: A | Discharge status: Home / Self Care | Payer: BLUE CROSS/BLUE SHIELD | Attending: Internal Medicine | Admitting: Internal Medicine

## 2023-02-27 ENCOUNTER — Other Ambulatory Visit: Payer: Self-pay

## 2023-02-27 ENCOUNTER — Emergency Department (HOSPITAL_COMMUNITY): Payer: Self-pay

## 2023-02-27 DIAGNOSIS — F17210 Nicotine dependence, cigarettes, uncomplicated: Secondary | ICD-10-CM | POA: Diagnosis present

## 2023-02-27 DIAGNOSIS — Z888 Allergy status to other drugs, medicaments and biological substances status: Secondary | ICD-10-CM

## 2023-02-27 DIAGNOSIS — M4622 Osteomyelitis of vertebra, cervical region: Secondary | ICD-10-CM | POA: Diagnosis not present

## 2023-02-27 DIAGNOSIS — R296 Repeated falls: Secondary | ICD-10-CM | POA: Diagnosis present

## 2023-02-27 DIAGNOSIS — Z720 Tobacco use: Secondary | ICD-10-CM

## 2023-02-27 DIAGNOSIS — E785 Hyperlipidemia, unspecified: Secondary | ICD-10-CM | POA: Diagnosis present

## 2023-02-27 DIAGNOSIS — Z87892 Personal history of anaphylaxis: Secondary | ICD-10-CM

## 2023-02-27 DIAGNOSIS — M4642 Discitis, unspecified, cervical region: Principal | ICD-10-CM | POA: Diagnosis present

## 2023-02-27 DIAGNOSIS — W19XXXA Unspecified fall, initial encounter: Secondary | ICD-10-CM | POA: Diagnosis present

## 2023-02-27 DIAGNOSIS — M4624 Osteomyelitis of vertebra, thoracic region: Secondary | ICD-10-CM | POA: Diagnosis present

## 2023-02-27 MED ORDER — HYDROCODONE-ACETAMINOPHEN 5-325 MG PO TABS
1.0000 | ORAL_TABLET | Freq: Once | ORAL | Status: AC
  Administered 2023-02-27: 1 via ORAL
  Filled 2023-02-27: qty 1

## 2023-02-27 MED ORDER — KETOROLAC TROMETHAMINE 15 MG/ML IJ SOLN
15.0000 mg | Freq: Once | INTRAMUSCULAR | Status: AC
  Administered 2023-02-27: 15 mg via INTRAMUSCULAR
  Filled 2023-02-27: qty 1

## 2023-02-27 NOTE — ED Triage Notes (Signed)
November 15th pt. Developed sob and was brought here was dc went home fell in the bathroom injured his back and found a pinched nerve.  He is having cervical and thoracic pain.  He is in constant pain, unable to sleep. Pt. Also having decreased appetite.Pt. was prescribed oxycodine and it is not helping at all.  He is unable to lay down or sit.    Pt. Is having bilateral arm numbness.  Took his last pain medication yesterday.

## 2023-02-27 NOTE — ED Provider Triage Note (Signed)
Emergency Medicine Provider Triage Evaluation Note  Elijah Pena , a 63 y.o. male  was evaluated in triage.  Pt complains of neck pain with radicular symptoms.  Review of Systems  Positive: Neck pain, intermittent bilateral arm weakness and numbness worse on the right side Negative: Chest pain, shortness of breath, abdominal pain.  Physical Exam  BP (!) 152/99 (BP Location: Left Arm)   Pulse (!) 104   Temp 98.2 F (36.8 C)   Resp 20   Ht 5' 7.5" (1.715 m)   Wt 60.3 kg   SpO2 99%   BMI 20.51 kg/m  Gen:   Awake, no distress   Resp:  Normal effort  MSK:   Walking with steady gait Other: Severe tenderness to palpation over cervical spine.  Patient has difficulty moving upper extremity secondary to pain of the cervical spine.  Strength equal bilaterally sensation equal bilaterally.  Neurovascularly intact.  Medical Decision Making  Medically screening exam initiated at 6:44 PM.  Appropriate orders placed.  Elijah Pena was informed that the remainder of the evaluation will be completed by another provider, this initial triage assessment does not replace that evaluation, and the importance of remaining in the ED until their evaluation is complete.  Has been dealing with neck and thoracic spine pain since November.  Initially had a fall.  Progressively worsening uncomfortable at home.  Has been seen by spine specialist who ordered MRI however this is not till February.  Denies any fever or chills.  No history of IV drug use.  No change in bowel or bladder.   Elijah Knudsen, PA-C 02/27/23 1851

## 2023-02-28 ENCOUNTER — Encounter (HOSPITAL_COMMUNITY): Payer: Self-pay | Admitting: Internal Medicine

## 2023-02-28 DIAGNOSIS — M4624 Osteomyelitis of vertebra, thoracic region: Secondary | ICD-10-CM | POA: Diagnosis present

## 2023-02-28 DIAGNOSIS — F17210 Nicotine dependence, cigarettes, uncomplicated: Secondary | ICD-10-CM | POA: Diagnosis present

## 2023-02-28 DIAGNOSIS — M4622 Osteomyelitis of vertebra, cervical region: Secondary | ICD-10-CM | POA: Diagnosis present

## 2023-02-28 DIAGNOSIS — E785 Hyperlipidemia, unspecified: Secondary | ICD-10-CM

## 2023-02-28 DIAGNOSIS — R296 Repeated falls: Secondary | ICD-10-CM | POA: Diagnosis present

## 2023-02-28 DIAGNOSIS — W19XXXA Unspecified fall, initial encounter: Secondary | ICD-10-CM | POA: Diagnosis present

## 2023-02-28 DIAGNOSIS — M4642 Discitis, unspecified, cervical region: Secondary | ICD-10-CM | POA: Diagnosis present

## 2023-02-28 DIAGNOSIS — Z720 Tobacco use: Secondary | ICD-10-CM | POA: Diagnosis not present

## 2023-02-28 DIAGNOSIS — Z888 Allergy status to other drugs, medicaments and biological substances status: Secondary | ICD-10-CM | POA: Diagnosis not present

## 2023-02-28 DIAGNOSIS — Z87892 Personal history of anaphylaxis: Secondary | ICD-10-CM | POA: Diagnosis not present

## 2023-02-28 LAB — COMPREHENSIVE METABOLIC PANEL
ALT: 19 U/L (ref 0–44)
ALT: 17 U/L (ref 0–44)
AST: 18 U/L (ref 15–41)
AST: 17 U/L (ref 15–41)
Albumin: 3.7 g/dL (ref 3.5–5.0)
Albumin: 3.3 g/dL — ABNORMAL LOW (ref 3.5–5.0)
Alkaline Phosphatase: 81 U/L (ref 38–126)
Alkaline Phosphatase: 75 U/L (ref 38–126)
Anion gap: 12 (ref 5–15)
Anion gap: 10 (ref 5–15)
BUN: 17 mg/dL (ref 8–23)
BUN: 17 mg/dL (ref 8–23)
CO2: 23 mmol/L (ref 22–32)
CO2: 24 mmol/L (ref 22–32)
Calcium: 10 mg/dL (ref 8.9–10.3)
Calcium: 9.6 mg/dL (ref 8.9–10.3)
Chloride: 99 mmol/L (ref 98–111)
Chloride: 101 mmol/L (ref 98–111)
Creatinine, Ser: 0.95 mg/dL (ref 0.61–1.24)
Creatinine, Ser: 0.88 mg/dL (ref 0.61–1.24)
GFR, Estimated: >60 mL/min (ref >60)
GFR, Estimated: >60 mL/min (ref >60)
Glucose, Bld: 112 mg/dL — ABNORMAL HIGH (ref 70–99)
Glucose, Bld: 132 mg/dL — ABNORMAL HIGH (ref 70–99)
Potassium: 3.9 mmol/L (ref 3.5–5.1)
Potassium: 4.2 mmol/L (ref 3.5–5.1)
Sodium: 134 mmol/L — ABNORMAL LOW (ref 135–145)
Sodium: 135 mmol/L (ref 135–145)
Total Bilirubin: 0.8 mg/dL (ref 0.0–1.2)
Total Bilirubin: 0.7 mg/dL (ref 0.0–1.2)
Total Protein: 7.3 g/dL (ref 6.5–8.1)
Total Protein: 6.4 g/dL — ABNORMAL LOW (ref 6.5–8.1)

## 2023-02-28 LAB — CBC WITH DIFFERENTIAL/PLATELET
Abs Immature Granulocytes: 0.03 10*3/uL (ref 0.00–0.07)
Abs Immature Granulocytes: 0.02 10*3/uL (ref 0.00–0.07)
Basophils Absolute: 0.1 10*3/uL (ref 0.0–0.1)
Basophils Absolute: 0.1 10*3/uL (ref 0.0–0.1)
Basophils Relative: 0 %
Basophils Relative: 1 %
Eosinophils Absolute: 0.1 10*3/uL (ref 0.0–0.5)
Eosinophils Absolute: 0.1 10*3/uL (ref 0.0–0.5)
Eosinophils Relative: 1 %
Eosinophils Relative: 1 %
HCT: 40.1 % (ref 39.0–52.0)
HCT: 37.8 % — ABNORMAL LOW (ref 39.0–52.0)
Hemoglobin: 13.6 g/dL (ref 13.0–17.0)
Hemoglobin: 12.9 g/dL — ABNORMAL LOW (ref 13.0–17.0)
Immature Granulocytes: 0 %
Immature Granulocytes: 0 %
Lymphocytes Relative: 15 %
Lymphocytes Relative: 23 %
Lymphs Abs: 2 10*3/uL (ref 0.7–4.0)
Lymphs Abs: 2.5 10*3/uL (ref 0.7–4.0)
MCH: 29.6 pg (ref 26.0–34.0)
MCH: 30.1 pg (ref 26.0–34.0)
MCHC: 33.9 g/dL (ref 30.0–36.0)
MCHC: 34.1 g/dL (ref 30.0–36.0)
MCV: 87.4 fL (ref 80.0–100.0)
MCV: 88.1 fL (ref 80.0–100.0)
Monocytes Absolute: 1.5 10*3/uL — ABNORMAL HIGH (ref 0.1–1.0)
Monocytes Absolute: 1 10*3/uL (ref 0.1–1.0)
Monocytes Relative: 11 %
Monocytes Relative: 10 %
Neutro Abs: 9.9 10*3/uL — ABNORMAL HIGH (ref 1.7–7.7)
Neutro Abs: 7 10*3/uL (ref 1.7–7.7)
Neutrophils Relative %: 73 %
Neutrophils Relative %: 65 %
Platelets: 312 10*3/uL (ref 150–400)
Platelets: 284 10*3/uL (ref 150–400)
RBC: 4.59 MIL/uL (ref 4.22–5.81)
RBC: 4.29 MIL/uL (ref 4.22–5.81)
RDW: 12.2 % (ref 11.5–15.5)
RDW: 12.4 % (ref 11.5–15.5)
WBC: 13.5 10*3/uL — ABNORMAL HIGH (ref 4.0–10.5)
WBC: 10.6 10*3/uL — ABNORMAL HIGH (ref 4.0–10.5)
nRBC: 0 % (ref 0.0–0.2)
nRBC: 0 % (ref 0.0–0.2)

## 2023-02-28 LAB — SEDIMENTATION RATE: Sed Rate: 52 mm/h — ABNORMAL HIGH (ref 0–16)

## 2023-02-28 LAB — C-REACTIVE PROTEIN: CRP: 2.1 mg/dL — ABNORMAL HIGH (ref <1.0)

## 2023-02-28 LAB — HIV ANTIBODY (ROUTINE TESTING W REFLEX): HIV Screen 4th Generation wRfx: NONREACTIVE

## 2023-02-28 MED ORDER — VANCOMYCIN HCL 1250 MG/250ML IV SOLN
1250.0000 mg | Freq: Once | INTRAVENOUS | Status: AC
  Administered 2023-02-28: 1250 mg via INTRAVENOUS
  Filled 2023-02-28: qty 250

## 2023-02-28 MED ORDER — ACETAMINOPHEN 650 MG RE SUPP: 650.0000 mg | Freq: Four times a day (QID) | RECTAL | Status: DC | PRN

## 2023-02-28 MED ORDER — HYDROMORPHONE HCL 2 MG PO TABS
2.0000 mg | ORAL_TABLET | ORAL | Status: DC | PRN
  Administered 2023-02-28 – 2023-03-02 (×7): 2 mg via ORAL
  Filled 2023-02-28 (×8): qty 1

## 2023-02-28 MED ORDER — HYDROCODONE-ACETAMINOPHEN 5-325 MG PO TABS
1.0000 | ORAL_TABLET | ORAL | Status: DC | PRN
  Administered 2023-02-28: 1 via ORAL
  Filled 2023-02-28: qty 1

## 2023-02-28 MED ORDER — SODIUM CHLORIDE 0.9 % IV SOLN
2.0000 g | Freq: Three times a day (TID) | INTRAVENOUS | Status: DC
  Administered 2023-02-28 – 2023-03-02 (×7): 2 g via INTRAVENOUS
  Filled 2023-02-28 (×7): qty 12.5

## 2023-02-28 MED ORDER — HYDROMORPHONE HCL 1 MG/ML IJ SOLN
1.0000 mg | INTRAMUSCULAR | Status: DC | PRN
  Administered 2023-02-28 – 2023-03-03 (×13): 1 mg via INTRAVENOUS
  Filled 2023-02-28 (×14): qty 1

## 2023-02-28 MED ORDER — VANCOMYCIN HCL 750 MG IV SOLR
750.0000 mg | Freq: Two times a day (BID) | INTRAVENOUS | Status: DC
  Administered 2023-03-01 (×3): 750 mg via INTRAVENOUS
  Filled 2023-02-28 (×5): qty 15

## 2023-02-28 MED ORDER — VANCOMYCIN HCL 750 MG/150ML IV SOLN
750.0000 mg | Freq: Two times a day (BID) | INTRAVENOUS | Status: DC
  Filled 2023-02-28: qty 150

## 2023-02-28 MED ORDER — NICOTINE 14 MG/24HR TD PT24
14.0000 mg | MEDICATED_PATCH | Freq: Every day | TRANSDERMAL | Status: DC
  Administered 2023-02-28 – 2023-03-03 (×4): 14 mg via TRANSDERMAL
  Filled 2023-02-28 (×4): qty 1

## 2023-02-28 MED ORDER — NICOTINE POLACRILEX 2 MG MT GUM: 2.0000 mg | CHEWING_GUM | OROMUCOSAL | Status: DC | PRN

## 2023-02-28 MED ORDER — DOCUSATE SODIUM 100 MG PO CAPS
200.0000 mg | ORAL_CAPSULE | Freq: Two times a day (BID) | ORAL | Status: DC
  Administered 2023-02-28 – 2023-03-03 (×7): 200 mg via ORAL
  Filled 2023-02-28 (×7): qty 2

## 2023-02-28 MED ORDER — ALBUTEROL SULFATE (2.5 MG/3ML) 0.083% IN NEBU
2.5000 mg | INHALATION_SOLUTION | RESPIRATORY_TRACT | Status: DC | PRN
  Filled 2023-02-28: qty 3

## 2023-02-28 MED ORDER — HYDROMORPHONE HCL 1 MG/ML IJ SOLN
1.0000 mg | Freq: Once | INTRAMUSCULAR | Status: AC
Start: 2023-02-28 — End: 2023-02-28
  Administered 2023-02-28: 1 mg via INTRAVENOUS
  Filled 2023-02-28: qty 1

## 2023-02-28 MED ORDER — SODIUM CHLORIDE 0.9 % IV SOLN
2.0000 g | Freq: Once | INTRAVENOUS | Status: AC
  Administered 2023-02-28: 2 g via INTRAVENOUS
  Filled 2023-02-28: qty 12.5

## 2023-02-28 MED ORDER — HYDROMORPHONE HCL 1 MG/ML IJ SOLN
1.0000 mg | Freq: Once | INTRAMUSCULAR | Status: AC
  Administered 2023-02-28: 1 mg via INTRAVENOUS
  Filled 2023-02-28: qty 1

## 2023-02-28 MED ORDER — METHOCARBAMOL 750 MG PO TABS
750.0000 mg | ORAL_TABLET | Freq: Four times a day (QID) | ORAL | Status: DC | PRN
  Administered 2023-02-28 – 2023-03-03 (×9): 750 mg via ORAL
  Filled 2023-02-28 (×9): qty 1

## 2023-02-28 MED ORDER — VANCOMYCIN HCL 1000 MG IV SOLR
750.0000 mg | Freq: Two times a day (BID) | INTRAVENOUS | Status: DC
  Administered 2023-02-28: 750 mg via INTRAVENOUS
  Filled 2023-02-28 (×2): qty 15

## 2023-02-28 MED ORDER — ACETAMINOPHEN 325 MG PO TABS
650.0000 mg | ORAL_TABLET | Freq: Four times a day (QID) | ORAL | Status: DC | PRN
  Administered 2023-02-28 – 2023-03-03 (×7): 650 mg via ORAL
  Filled 2023-02-28 (×7): qty 2

## 2023-02-28 NOTE — Assessment & Plan Note (Addendum)
02-28-2023 continue with cefepime/vanco. ID consulted for abx management. IR and neurosurgery consulted. Will rx IV and po dilaudid prn for pain. Pt and dtr warned about addictive potential of dilaudid. Pt states that norco and oxycodone have not helped with his pain. Hold on any chemical DVT prophylaxis until he gets his biopsy/disc aspiration. SCDs for now. I reviewed pt's MRI images with pt's dtr at her request. Permission given by patient to allow dtr to look at his images. 03-01-2023 remains on IV ABX with cefepime/vanco. Awaiting disc aspiration vs bone biopsy tomorrow. Npo after MN. Continue with prn IV dilaudid/po dilaudid. Pt already has prn robaxin ordered. Start bowel program to prevent opioid-induced constipation. 03-02-2023 no window for bone culture or aspiration. Pt with carotid artery in the way of any possible biopsy pathway.  IR not going to proceed with any procedure. ID aware. Continue with po robaxin as needed.  03-03-2023 Pt seen by ID. OPAT orders written. PICC placed. Pt to go home today on Rocephin and Daptomycin for 6 weeks. Home with prn dilaudid and robaxin. Discussed with pt's dtr Lawanna Kobus by phone.

## 2023-02-28 NOTE — Progress Notes (Signed)
PROGRESS NOTE    MCHENRY LABA  MWU:[id number] DOB: 01-24-1961 DOA: 02/27/2023 PCP: Alois Cliche, MD  Subjective: Pt seen and examined. Met with pt and his dtr Angel at bedside. Dtr is an [employer] fundraiser.  Pt fell multiple times in November 2024. Had a laceration to his face at the same time.  MRI shows cervical discitis with bone marrow edema at C6-C7 vertebral bodies.  Neurosurgery, ID and IR consulted.  Plan for bone biopsy/disc aspiration/etc on Monday.   Hospital Course: No notes on file   Assessment and Plan: * Cervical discitis 02-28-2023 continue with cefepime/vanco. ID consulted for abx management. IR and neurosurgery consulted. Will rx IV and po dilaudid prn for pain. Pt and dtr warned about addictive potential of dilaudid. Pt states that norco and oxycodone have not helped with his pain. Hold on any chemical DVT prophylaxis until he gets his biopsy/disc aspiration. SCDs for now.  Tobacco abuse 02-28-2023 pt advised to quit. Rx nicotine patch and gum.  HLD (hyperlipidemia) 02-28-2023 stable  DVT prophylaxis: SCDs Start: 02/28/23 7253    Code Status: Full Code Family Communication: discussed with pt and dtr angel at bedside Disposition Plan: home Reason for continuing need for hospitalization: remains on IV Abx.  Objective: Vitals:   02/28/23 0715 02/28/23 0810 02/28/23 0832 02/28/23 1100  BP: 112/87 (!) 103/55  128/71  Pulse: 81 77  80  Resp:  16  18  Temp:   98.3 F (36.8 C)   TempSrc:   Oral   SpO2: 94% 96%  96%  Weight:      Height:        Intake/Output Summary (Last 24 hours) at 02/28/2023 1259 Last data filed at 02/28/2023 0153 Gross per 24 hour  Intake 100 ml  Output --  Net 100 ml   Filed Weights   02/27/23 1818  Weight: 60.3 kg    Examination:  Physical Exam Vitals and nursing note reviewed.  Constitutional:      Comments: Appears chronically ill, thin.  HENT:     Head: Normocephalic and atraumatic.     Nose: Nose normal.  Eyes:      General: No scleral icterus. Cardiovascular:     Rate and Rhythm: Normal rate and regular rhythm.  Pulmonary:     Effort: Pulmonary effort is normal. No respiratory distress.     Breath sounds: Rhonchi present. No wheezing.     Comments: Scattered rhonchi. No wheezing  Abdominal:     General: Bowel sounds are normal.     Palpations: Abdomen is soft.  Musculoskeletal:     Right lower leg: No edema.     Left lower leg: No edema.  Skin:    General: Skin is warm and dry.     Capillary Refill: Capillary refill takes less than 2 seconds.  Neurological:     Mental Status: He is alert and oriented to person, place, and time.     Data Reviewed: I have personally reviewed following labs and imaging studies  CBC: Recent Labs  Lab 02/28/23 0100 02/28/23 0656  WBC 13.5* 10.6*  NEUTROABS 9.9* 7.0  HGB 13.6 12.9*  HCT 40.1 37.8*  MCV 87.4 88.1  PLT 312 284   Basic Metabolic Panel: Recent Labs  Lab 02/28/23 0100 02/28/23 0656  NA 134* 135  K 3.9 4.2  CL 99 101  CO2 23 24  GLUCOSE 112* 132*  BUN 17 17  CREATININE 0.95 0.88  CALCIUM 10.0 9.6   GFR: Estimated Creatinine Clearance:  72.3 mL/min (by C-G formula based on SCr of 0.88 mg/dL). Liver Function Tests: Recent Labs  Lab 02/28/23 0100 02/28/23 0656  AST 18 17  ALT 19 17  ALKPHOS 81 75  BILITOT 0.8 0.7  PROT 7.3 6.4*  ALBUMIN 3.7 3.3*    Recent Results (from the past 240 hours)  Blood culture (routine x 2)     Status: None (Preliminary result)   Collection Time: 02/28/23  1:00 AM   Specimen: BLOOD  Result Value Ref Range Status   Specimen Description BLOOD RIGHT ANTECUBITAL  Final   Special Requests   Final    BOTTLES DRAWN AEROBIC AND ANAEROBIC Blood Culture results may not be optimal due to an inadequate volume of blood received in culture bottles   Culture   Final    NO GROWTH < 12 HOURS Performed at [facility], [address] [place], [place] [address]    Report Status PENDING  Incomplete   Blood culture (routine x 2)     Status: None (Preliminary result)   Collection Time: 02/28/23  1:00 AM   Specimen: BLOOD  Result Value Ref Range Status   Specimen Description BLOOD BLOOD RIGHT FOREARM  Final   Special Requests   Final    BOTTLES DRAWN AEROBIC AND ANAEROBIC Blood Culture adequate volume   Culture   Final    NO GROWTH < 12 HOURS Performed at [facility], [address], [place], [place] [address]    Report Status PENDING  Incomplete     Radiology Studies: MR Cervical Spine Wo Contrast Result Date: 02/27/2023 CLINICAL DATA:  Neck pain, chronic, degenerative changes on xray. Right arm weakness EXAM: MRI CERVICAL SPINE WITHOUT CONTRAST TECHNIQUE: Multiplanar, multisequence MR imaging of the cervical spine was performed. No intravenous contrast was administered. COMPARISON:  None Available. FINDINGS: Technical Note: Despite efforts by the technologist and patient, motion artifact is present on today's exam and could not be eliminated. This reduces exam sensitivity and specificity. Alignment: Trace retrolisthesis at C5-6. Vertebrae: Bone marrow edema throughout the C6 and C7 vertebral bodies with confluent low T1 marrow signal changes. Marrow edema extends to the right C7 pedicle. Patchy bone marrow edema is also present within the T1 vertebral body anteriorly. No fluid signal within the intervertebral discs. Subchondral marrow edema associated with the right C7-T1 facet joint. There is height loss of the C7 vertebral body, age indeterminate. Cord: No convincing site of cervical cord signal abnormality on motion degraded images. Posterior Fossa, vertebral arteries, paraspinal tissues: T2/STIR hyperintense signal in the prevertebral soft tissues anterior to the lower cervical spine centered at C7, more pronounced on the right (series 7, image 3). Disc levels: C2-C3: No significant disc protrusion, foraminal stenosis, or canal stenosis. C3-C4: Disc osteophyte complex with left  greater than right facet and uncovertebral arthropathy. Moderate-severe left and mild right foraminal stenosis. No canal stenosis. C4-C5: Disc osteophyte complex with facet and uncovertebral arthropathy. Moderate bilateral foraminal stenosis, right worse than left. No canal stenosis. C5-C6: Disc osteophyte complex with bilateral facet and uncovertebral arthropathy. Moderate canal stenosis with severe bilateral foraminal stenosis. C6-C7: Disc osteophyte complex with facet and uncovertebral arthropathy. Mild canal stenosis with severe bilateral foraminal stenosis. C7-T1: No disc protrusion. Bilateral facet arthropathy. Borderline-mild canal stenosis. Moderate bilateral foraminal stenosis, worse on the right. IMPRESSION: 1. Bone marrow edema throughout the C6 and C7 vertebral bodies with confluent low T1 marrow signal changes. Patchy bone marrow edema is also present within the T1 vertebral body  anteriorly. 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 Septic arthritis of the right C7-T1 facet joint is also a possibility. Correlation with ESR and CRP is recommended. In the absence of signs of systemic infection/inflammation, a infiltrative marrow replacing neoplasm should be considered. 2. Prevertebral soft tissue edema anterior to the lower cervical spine centered at C7, more pronounced on the right, favoring prevertebral phlegmon. 3. Multilevel cervical spondylosis, most pronounced at C5-6 where there is moderate canal stenosis and severe bilateral foraminal stenosis. 4. Moderate-severe left foraminal stenosis at C3-4. Moderate bilateral foraminal stenosis at C4-5 and C7-T1. Electronically Signed   By: Duanne Guess D.O.   On: 02/27/2023 20:37    Scheduled Meds:  docusate sodium  200 mg Oral BID   nicotine  14 mg Transdermal Daily   Continuous Infusions:  ceFEPime (MAXIPIME) IV Stopped (02/28/23 0858)   vancomycin 750 mg (02/28/23 1232)     LOS: 0 days   Time spent: 40 minutes  Carollee Herter, DO  [facility]  02/28/2023, 12:59 PM

## 2023-02-28 NOTE — Progress Notes (Signed)
Interventional Radiology Brief Note:  Elijah Pena is a 63 y.o. male admitted on 02/27/2023 with cervical discitis, septic arthritis and possible prevertebral phlegmon.  IR consulted for disc aspiration.  Patient currently on empiric treatment.  Will review with NIR MD for possible procedure early next week.  Ordering team made aware.   Loyce Dys, MS RD PA-C

## 2023-02-28 NOTE — ED Notes (Signed)
ED Provider at bedside. 

## 2023-02-28 NOTE — Assessment & Plan Note (Signed)
02-28-2023 pt advised to quit. Rx nicotine patch and gum. 03-01-2023 stable

## 2023-02-28 NOTE — Plan of Care (Signed)

## 2023-02-28 NOTE — Assessment & Plan Note (Signed)
02-28-2023 stable 03-01-2023 stable  03-03-2023 hold statin while on daptomycin.

## 2023-02-28 NOTE — Consult Note (Signed)
[facility]    Date of Admission:  02/27/2023     Reason for Consult: cervical osteomyelitis    Referring Provider: Carollee Herter     Abx: 02/27/23 - c vanc 02/27/23 - c cefepime        Assessment: 63 yo male hx tobacco abuse, hx fall 12/2022 with neck pain since, admitted 02/27/23 for progressive neck pain with bilateral upper extremities radiculopathic symptoms, found to have mri finding c6-7 and c7-T1 vertebral om/facet joint septic arthritis and prevertebral phlegmon  He was given 7 day levofloxacin late November 2024 after neck pain started I am uncleared what was for  1/18 hsCRP 2.1 and sed rate 52  1/18 bcx in progress  ED discussed with NSG who recommends medical management. Started on vanc/cefepime in ER   Plan: Given cspine and radiculopathic symptoms, ok to continue antibiotics for now Agree with discussing with IR to evaluate for c-spine vertebral/disc or facet joint sampling for culture Discussed with primary team      ------------------------------------------------ Principal Problem:   Cervical discitis Active Problems:   HLD (hyperlipidemia)   Tobacco abuse   Discitis    HPI: Elijah Pena is a 63 y.o. male hx tobacco abuse, hx fall 12/2022 with neck pain since, admitted 02/27/23 for progressive neck pain with bilateral upper extremities radiculopathic symptoms, found to have mri finding c6-7 and c7-T1 vertebral om/facet joint septic arthritis   He has had progressive pain in the neck since the fall  He was given 7 day levofloxacin late November 2024 after neck pain started I am uncleared what was for  Due to progression of sx and bilateral upper arms numbness/tingling he came to [facility] for evaluation  Feels hot/warm but no documented fever that he took  No nightsweat/weight loss leading to this Denies ivdu No prior spine infection No recent medical dental procedure prior to the onset of neck  pain   Currently no urinary incontinence/retention or numbness/tingling/weakness in legs   Afebrile and no leukocytosis on presentation Mri spine as below Nsg was contacted in ed but no surgical intervention ?primary team had contacted ir for potential sampling of cspine next week  Patient started on vanc/cefepime    History reviewed. No pertinent family history.  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 No pertinent past medical history.     Scheduled Meds:  nicotine  14 mg Transdermal Daily   Continuous Infusions:  ceFEPime (MAXIPIME) IV Stopped (02/28/23 0858)   vancomycin 750 mg (02/28/23 1232)   PRN Meds:.acetaminophen **OR** acetaminophen, albuterol, nicotine polacrilex   OBJECTIVE: Blood pressure 128/71, pulse 80, temperature 98.3 F (36.8 C), temperature source Oral, resp. rate 18, height 5' 7.5" (1.715 m), weight 60.3 kg, SpO2 96%.  Physical Exam  General/constitutional: no distress, pleasant HEENT: Normocephalic, PER, Conj Clear, EOMI, Oropharynx clear Neck supple CV: rrr no mrg Lungs: clear to auscultation, normal respiratory effort Abd: Soft, Nontender Ext: no edema Skin: No Rash Neuro: nonfocal MSK: tender  on cspine rom and palpation; no peripheral joint swelling/tenderness/warmth; back spines nontender   Lab Results Lab Results  Component Value Date   WBC 10.6 (H) 02/28/2023   HGB 12.9 (L) 02/28/2023   HCT 37.8 (L) 02/28/2023   MCV 88.1 02/28/2023   PLT 284 02/28/2023    Lab Results  Component Value Date   CREATININE 0.88 02/28/2023   BUN 17  02/28/2023   NA 135 02/28/2023   K 4.2 02/28/2023   CL 101 02/28/2023   CO2 24 02/28/2023    Lab Results  Component Value Date   ALT 17 02/28/2023   AST 17 02/28/2023   ALKPHOS 75 02/28/2023   BILITOT 0.7 02/28/2023      Microbiology: Recent Results (from the past 240 hours)  Blood culture (routine x 2)     Status: None (Preliminary result)   Collection Time: 02/28/23  1:00 AM   Specimen: BLOOD  Result Value Ref Range Status   Specimen Description BLOOD RIGHT ANTECUBITAL  Final   Special Requests   Final    BOTTLES DRAWN AEROBIC AND ANAEROBIC Blood Culture results may not be optimal due to an inadequate volume of blood received in culture bottles   Culture   Final    NO GROWTH < 12 HOURS Performed at [facility], [address] [place], [place] [address]    Report Status PENDING  Incomplete  Blood culture (routine x 2)     Status: None (Preliminary result)   Collection Time: 02/28/23  1:00 AM   Specimen: BLOOD  Result Value Ref Range Status   Specimen Description BLOOD BLOOD RIGHT FOREARM  Final   Special Requests   Final    BOTTLES DRAWN AEROBIC AND ANAEROBIC Blood Culture adequate volume   Culture   Final    NO GROWTH < 12 HOURS Performed at [facility], [address], [place], [place] [address]    Report Status PENDING  Incomplete     Serology:    Imaging: If present, new imagings (plain films, ct scans, and mri) have been personally visualized and interpreted; radiology reports have been reviewed. Decision making incorporated into the Impression / Recommendations.  02/27/23 mri cspine noncontrast 1. Bone marrow edema throughout the C6 and C7 vertebral bodies with confluent low T1 marrow signal changes. Patchy bone marrow edema is also present within the T1 vertebral body anteriorly. 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 Septic arthritis of the right C7-T1 facet joint is also a possibility. Correlation with ESR and CRP is  recommended. In the absence of signs of systemic infection/inflammation, a infiltrative marrow replacing neoplasm should be considered. 2. Prevertebral soft tissue edema anterior to the lower cervical spine centered at C7, more pronounced on the right, favoring prevertebral phlegmon. 3. Multilevel cervical spondylosis, most pronounced at C5-6 where there is moderate canal stenosis and severe bilateral foraminal stenosis. 4. Moderate-severe left foraminal stenosis at C3-4. Moderate bilateral foraminal stenosis at C4-5 and C7-T1.  Raymondo Band, MD [facility] [phone] pager    02/28/2023, 12:45 PM

## 2023-02-28 NOTE — Subjective & Objective (Signed)
Pt seen and examined. Pt seen by ID OPAT orders written. PICC placed Pt to go home today on Rocephin and Daptomycin for 6 weeks.

## 2023-02-28 NOTE — Progress Notes (Signed)
Pharmacy Antibiotic Note  Elijah Pena is a 63 y.o. male admitted on 02/27/2023 with cervical discitis, septic arthritis and possible prevertebral phlegmon.  Pharmacy has been consulted for vancomycin and cefepime dosing.  Plan: Vancomycin 750mg  IV every 12 hours.  Goal trough 15-20 mcg/mL. Cefepime 2g IV Q8H.  Height: 5' 7.5" (171.5 cm) Weight: 60.3 kg (132 lb 15 oz) IBW/kg (Calculated) : 67.25  Temp (24hrs), Avg:98.7 F (37.1 C), Min:98.2 F (36.8 C), Max:98.9 F (37.2 C)  Recent Labs  Lab 02/28/23 0100 02/28/23 0656  WBC 13.5* 10.6*  CREATININE 0.95 0.88    Estimated Creatinine Clearance: 72.3 mL/min (by C-G formula based on SCr of 0.88 mg/dL).    Allergies  Allergen Reactions   Wellbutrin [Bupropion] Anaphylaxis   Nicoderm [Nicotine] Rash    Thank you for allowing pharmacy to be a part of this patient's care.  Vernard Gambles, PharmD, BCPS  02/28/2023 7:45 AM

## 2023-02-28 NOTE — H&P (Addendum)
History and Physical    Elijah Pena GYJ:[id number] DOB: 1960-12-30 DOA: 02/27/2023  Patient coming from: Home.  Chief Complaint: Neck pain.  HPI: Elijah Pena is a 63 y.o. male with history of tobacco abuse had a fall in November 2024 since then has been hurting in his neck.  Pain has been gradually worsening radiating to both his arms.  About 10 days ago patient had followed up with spine surgeons in [facility].  Plan was to get MRI of the C-spine but due to worsening pain patient presents to the ER.  Denies any incontinence of urine or bowel.  Denies any fever chills.  ED Course: In the ER patient appears nonfocal.  MRI of the C-spine shows concerning features for discitis/osteomyelitis involving the C6-C7 and C7 and T1 areas and also prevertebral phlegmon involving the C7 area.  On-call neurosurgery was consulted and requested medical management at this time.  Blood cultures obtained patient was started on vancomycin and cefepime by the ER physician.  Review of Systems: As per HPI, rest all negative.   History reviewed. No pertinent past medical history.  Past Surgical History:  Procedure Laterality Date   APPENDECTOMY       reports that he has been smoking cigarettes. He has never used smokeless tobacco. He reports that he does not currently use alcohol. He reports that he does not use drugs.  Allergies  Allergen Reactions   Wellbutrin [Bupropion] Anaphylaxis   Nicoderm [Nicotine] Rash    History reviewed. No pertinent family history.  Prior to Admission medications   Medication Sig Start Date End Date Taking? Authorizing Provider  albuterol (VENTOLIN HFA) 108 (90 Base) MCG/ACT inhaler Inhale 2 puffs into the lungs every 4 (four) hours as needed for wheezing or shortness of breath. 12/23/22  Yes Roxy Horseman, PA-C  rosuvastatin (CRESTOR) 10 MG tablet Take 10 mg by mouth at bedtime. Patient not taking: Reported on 02/28/2023 02/24/23   [provider]     Physical Exam: Constitutional: Moderately built and nourished. Vitals:   02/28/23 0445 02/28/23 0500 02/28/23 0515 02/28/23 0530  BP: (!) 150/91 125/78 131/73 106/73  Pulse: 85 72    Resp:      Temp:      TempSrc:      SpO2: 98% 98%    Weight:      Height:       Eyes: Anicteric no pallor. ENMT: No discharge from the ears eyes nose or mouth. Neck: No mass felt.  No neck rigidity. Respiratory: No rhonchi or crepitations. Cardiovascular: S1-S2 heard. Abdomen: Soft nontender bowel sounds present. Musculoskeletal: No edema. Skin: No rash. Neurologic: Alert awake oriented to time place and person.  Moving all extremities 5 x 5.  No facial asymmetry tongue is midline pupils are equal and reacting to light. Psychiatric: Appears normal.  Normal affect.   Labs on Admission: I have personally reviewed following labs and imaging studies  CBC: Recent Labs  Lab 02/28/23 0100  WBC 13.5*  NEUTROABS 9.9*  HGB 13.6  HCT 40.1  MCV 87.4  PLT 312   Basic Metabolic Panel: Recent Labs  Lab 02/28/23 0100  NA 134*  K 3.9  CL 99  CO2 23  GLUCOSE 112*  BUN 17  CREATININE 0.95  CALCIUM 10.0   GFR: Estimated Creatinine Clearance: 67 mL/min (by C-G formula based on SCr of 0.95 mg/dL). Liver Function Tests: Recent Labs  Lab 02/28/23 0100  AST 18  ALT 19  ALKPHOS 81  BILITOT  0.8  PROT 7.3  ALBUMIN 3.7   No results for input(s): "LIPASE", "AMYLASE" in the last 168 hours. No results for input(s): "AMMONIA" in the last 168 hours. Coagulation Profile: No results for input(s): "INR", "PROTIME" in the last 168 hours. Cardiac Enzymes: No results for input(s): "CKTOTAL", "CKMB", "CKMBINDEX", "TROPONINI" in the last 168 hours. BNP (last 3 results) No results for input(s): "PROBNP" in the last 8760 hours. HbA1C: No results for input(s): "HGBA1C" in the last 72 hours. CBG: No results for input(s): "GLUCAP" in the last 168 hours. Lipid Profile: No results for input(s): "CHOL",  "HDL", "LDLCALC", "TRIG", "CHOLHDL", "LDLDIRECT" in the last 72 hours. Thyroid Function Tests: No results for input(s): "TSH", "T4TOTAL", "FREET4", "T3FREE", "THYROIDAB" in the last 72 hours. Anemia Panel: No results for input(s): "VITAMINB12", "FOLATE", "FERRITIN", "TIBC", "IRON", "RETICCTPCT" in the last 72 hours. Urine analysis:    Component Value Date/Time   COLORURINE YELLOW 12/20/2007 0800   APPEARANCEUR CLEAR 12/20/2007 0800   LABSPEC 1.021 12/20/2007 0800   PHURINE 5.5 12/20/2007 0800   GLUCOSEU NEGATIVE 12/20/2007 0800   HGBUR NEGATIVE 12/20/2007 0800   BILIRUBINUR NEGATIVE 12/20/2007 0800   KETONESUR 15 (A) 12/20/2007 0800   PROTEINUR NEGATIVE 12/20/2007 0800   UROBILINOGEN 1.0 12/20/2007 0800   NITRITE NEGATIVE 12/20/2007 0800   LEUKOCYTESUR  12/20/2007 0800    NEGATIVE MICROSCOPIC NOT DONE ON URINES WITH NEGATIVE PROTEIN, BLOOD, LEUKOCYTES, NITRITE, OR GLUCOSE <1000 mg/dL.   Sepsis Labs: @LABRCNTIP (procalcitonin:4,lacticidven:4) )No results found for this or any previous visit (from the past 240 hours).   Radiological Exams on Admission: MR Cervical Spine Wo Contrast Result Date: 02/27/2023 CLINICAL DATA:  Neck pain, chronic, degenerative changes on xray. Right arm weakness EXAM: MRI CERVICAL SPINE WITHOUT CONTRAST TECHNIQUE: Multiplanar, multisequence MR imaging of the cervical spine was performed. No intravenous contrast was administered. COMPARISON:  None Available. FINDINGS: Technical Note: Despite efforts by the technologist and patient, motion artifact is present on today's exam and could not be eliminated. This reduces exam sensitivity and specificity. Alignment: Trace retrolisthesis at C5-6. Vertebrae: Bone marrow edema throughout the C6 and C7 vertebral bodies with confluent low T1 marrow signal changes. Marrow edema extends to the right C7 pedicle. Patchy bone marrow edema is also present within the T1 vertebral body anteriorly. No fluid signal within the  intervertebral discs. Subchondral marrow edema associated with the right C7-T1 facet joint. There is height loss of the C7 vertebral body, age indeterminate. Cord: No convincing site of cervical cord signal abnormality on motion degraded images. Posterior Fossa, vertebral arteries, paraspinal tissues: T2/STIR hyperintense signal in the prevertebral soft tissues anterior to the lower cervical spine centered at C7, more pronounced on the right (series 7, image 3). Disc levels: C2-C3: No significant disc protrusion, foraminal stenosis, or canal stenosis. C3-C4: Disc osteophyte complex with left greater than right facet and uncovertebral arthropathy. Moderate-severe left and mild right foraminal stenosis. No canal stenosis. C4-C5: Disc osteophyte complex with facet and uncovertebral arthropathy. Moderate bilateral foraminal stenosis, right worse than left. No canal stenosis. C5-C6: Disc osteophyte complex with bilateral facet and uncovertebral arthropathy. Moderate canal stenosis with severe bilateral foraminal stenosis. C6-C7: Disc osteophyte complex with facet and uncovertebral arthropathy. Mild canal stenosis with severe bilateral foraminal stenosis. C7-T1: No disc protrusion. Bilateral facet arthropathy. Borderline-mild canal stenosis. Moderate bilateral foraminal stenosis, worse on the right. IMPRESSION: 1. Bone marrow edema throughout the C6 and C7 vertebral bodies with confluent low T1 marrow signal changes. Patchy bone marrow edema is also present within the  T1 vertebral body anteriorly. 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 Septic arthritis of the right C7-T1 facet joint is also a possibility. Correlation with ESR and CRP is recommended. In the absence of signs of systemic infection/inflammation, a infiltrative marrow replacing neoplasm should be considered. 2. Prevertebral soft tissue edema anterior to the lower cervical spine centered at C7, more pronounced on the right, favoring  prevertebral phlegmon. 3. Multilevel cervical spondylosis, most pronounced at C5-6 where there is moderate canal stenosis and severe bilateral foraminal stenosis. 4. Moderate-severe left foraminal stenosis at C3-4. Moderate bilateral foraminal stenosis at C4-5 and C7-T1. Electronically Signed   By: Duanne Guess D.O.   On: 02/27/2023 20:37     Assessment/Plan Principal Problem:   Cervical discitis Active Problems:   HLD (hyperlipidemia)   Tobacco abuse   Discitis    Cervical discitis/osteomyelitis/septic arthritis with possible prevertebral phlegmon involving the C5-C6-C7 and T1 areas.  Discussed with on-call neurosurgery at this time requested medical management.  Blood cultures obtained and patient has been already started on empiric antibiotics.  Will order interventional radiology consult for possible aspiration of the disc site for cultures.  Consult infectious disease in the morning. Tobacco abuse advised about quitting. Hyperlipidemia patient was recently started on statins and patient has not been taking it.  Since patient is having possible osteomyelitis and discitis involving the cervical spine with possible prevertebral phlegmon will need close monitoring and more than 2 midnight stay in inpatient status.   DVT prophylaxis: SCDs. Code Status: Full code. Family Communication: Patient's son at the bedside. Disposition Plan: Medical floor. Consults called: Discussed with neurosurgery. Admission status: Inpatient.

## 2023-02-28 NOTE — Progress Notes (Signed)
ED Pharmacy Antibiotic Sign Off An antibiotic consult was received from an ED provider for vancomycin and cefepime per pharmacy dosing for osteomyelitis. A chart review was completed to assess appropriateness.   The following one time order(s) were placed:  Vancomycin 1250mg  IV x1 Cefepim 2g IV x1  Further antibiotic and/or antibiotic pharmacy consults should be ordered by the admitting provider if indicated.   Thank you for allowing pharmacy to be a part of this patient's care.   Vernard Gambles, PharmD, BCPS   Clinical Pharmacist 02/28/23 12:38 AM

## 2023-02-28 NOTE — ED Provider Notes (Signed)
[facility]PARTMENT AT [facility] Provider Note   CSN: [id number] Arrival date & time: 02/27/23  1701     History  No chief complaint on file.   Elijah Pena is a 63 y.o. male.  The history is provided by the patient and medical records.   63 year old male presenting to the ED for ongoing neck/upper back pain.  Patient reports in November he had a fall x 3 in his bathroom.  States he is not really sure why he kept falling, almost seem like his legs would not hold him up.  Since that time he has been steadily taking ibuprofen but has not had any improvement of his pain.  Over the past few weeks pain has intensified, began having some intermittent numbness and tingling in his right arm particularly.  He has not had any focal weakness of the extremities.  He has not had any low back pain, bowel/bladder incontinence.  He did see Dr. Noel Gerold with [facility], recommended MRI cervical spine, however this could not be arranged until February.  Patient reports pain has become intolerable.  He has had some chills recently but denies any known fever.  He denies any history of alcohol or drug use.  Home Medications Prior to Admission medications   Medication Sig Start Date End Date Taking? Authorizing Provider  albuterol (VENTOLIN HFA) 108 (90 Base) MCG/ACT inhaler Inhale 2 puffs into the lungs every 4 (four) hours as needed for wheezing or shortness of breath. 12/23/22   Roxy Horseman, PA-C  nicotine (NICODERM CQ - DOSED IN MG/24 HOURS) 21 mg/24hr patch Place 1 patch (21 mg total) onto the skin daily. 12/23/22   Roxy Horseman, PA-C  nicotine polacrilex (NICORETTE) 2 MG gum Chew a piece of gum once every hour. Patient not taking: Reported on 03/17/2017 03/03/17   Wallis Bamberg, PA-C  predniSONE (DELTASONE) 20 MG tablet Take 2 tablets (40 mg total) by mouth daily. 12/23/22   Roxy Horseman, PA-C      Allergies    Wellbutrin [bupropion] and Nicoderm [nicotine]    Review  of Systems   Review of Systems  Musculoskeletal:  Positive for neck pain.  All other systems reviewed and are negative.   Physical Exam Updated Vital Signs BP (!) 145/97 (BP Location: Right Arm)   Pulse 88   Temp 98.9 F (37.2 C) (Oral)   Resp 17   Ht 5' 7.5" (1.715 m)   Wt 60.3 kg   SpO2 98%   BMI 20.51 kg/m  Physical Exam Vitals and nursing note reviewed.  Constitutional:      Appearance: He is well-developed.  HENT:     Head: Normocephalic and atraumatic.  Eyes:     Conjunctiva/sclera: Conjunctivae normal.     Pupils: Pupils are equal, round, and reactive to light.  Cardiovascular:     Rate and Rhythm: Normal rate and regular rhythm.     Heart sounds: Normal heart sounds.  Pulmonary:     Effort: Pulmonary effort is normal.     Breath sounds: Normal breath sounds.  Abdominal:     General: Bowel sounds are normal.     Palpations: Abdomen is soft.  Musculoskeletal:        General: Normal range of motion.     Cervical back: Normal range of motion.     Comments: Tenderness along lower cervical/upper thoracic spine, there is no acute deformity, no overlying skin changes, no appreciable swelling  Skin:    General: Skin is  warm and dry.  Neurological:     Mental Status: He is alert and oriented to person, place, and time.     Comments: Normal strength and sensation all 4 extremities, gait normal     ED Results / Procedures / Treatments   Labs (all labs ordered are listed, but only abnormal results are displayed) Labs Reviewed  CBC WITH DIFFERENTIAL/PLATELET - Abnormal; Notable for the following components:      Result Value   WBC 13.5 (*)    Neutro Abs 9.9 (*)    Monocytes Absolute 1.5 (*)    All other components within normal limits  COMPREHENSIVE METABOLIC PANEL - Abnormal; Notable for the following components:   Sodium 134 (*)    Glucose, Bld 112 (*)    All other components within normal limits  SEDIMENTATION RATE - Abnormal; Notable for the following  components:   Sed Rate 52 (*)    All other components within normal limits  C-REACTIVE PROTEIN - Abnormal; Notable for the following components:   CRP 2.1 (*)    All other components within normal limits  CULTURE, BLOOD (ROUTINE X 2)  CULTURE, BLOOD (ROUTINE X 2)    EKG None  Radiology MR Cervical Spine Wo Contrast Result Date: 02/27/2023 CLINICAL DATA:  Neck pain, chronic, degenerative changes on xray. Right arm weakness EXAM: MRI CERVICAL SPINE WITHOUT CONTRAST TECHNIQUE: Multiplanar, multisequence MR imaging of the cervical spine was performed. No intravenous contrast was administered. COMPARISON:  None Available. FINDINGS: Technical Note: Despite efforts by the technologist and patient, motion artifact is present on today's exam and could not be eliminated. This reduces exam sensitivity and specificity. Alignment: Trace retrolisthesis at C5-6. Vertebrae: Bone marrow edema throughout the C6 and C7 vertebral bodies with confluent low T1 marrow signal changes. Marrow edema extends to the right C7 pedicle. Patchy bone marrow edema is also present within the T1 vertebral body anteriorly. No fluid signal within the intervertebral discs. Subchondral marrow edema associated with the right C7-T1 facet joint. There is height loss of the C7 vertebral body, age indeterminate. Cord: No convincing site of cervical cord signal abnormality on motion degraded images. Posterior Fossa, vertebral arteries, paraspinal tissues: T2/STIR hyperintense signal in the prevertebral soft tissues anterior to the lower cervical spine centered at C7, more pronounced on the right (series 7, image 3). Disc levels: C2-C3: No significant disc protrusion, foraminal stenosis, or canal stenosis. C3-C4: Disc osteophyte complex with left greater than right facet and uncovertebral arthropathy. Moderate-severe left and mild right foraminal stenosis. No canal stenosis. C4-C5: Disc osteophyte complex with facet and uncovertebral arthropathy.  Moderate bilateral foraminal stenosis, right worse than left. No canal stenosis. C5-C6: Disc osteophyte complex with bilateral facet and uncovertebral arthropathy. Moderate canal stenosis with severe bilateral foraminal stenosis. C6-C7: Disc osteophyte complex with facet and uncovertebral arthropathy. Mild canal stenosis with severe bilateral foraminal stenosis. C7-T1: No disc protrusion. Bilateral facet arthropathy. Borderline-mild canal stenosis. Moderate bilateral foraminal stenosis, worse on the right. IMPRESSION: 1. Bone marrow edema throughout the C6 and C7 vertebral bodies with confluent low T1 marrow signal changes. Patchy bone marrow edema is also present within the T1 vertebral body anteriorly. 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 Septic arthritis of the right C7-T1 facet joint is also a possibility. Correlation with ESR and CRP is recommended. In the absence of signs of systemic infection/inflammation, a infiltrative marrow replacing neoplasm should be considered. 2. Prevertebral soft tissue edema anterior to the lower cervical spine centered at C7, more pronounced on the  right, favoring prevertebral phlegmon. 3. Multilevel cervical spondylosis, most pronounced at C5-6 where there is moderate canal stenosis and severe bilateral foraminal stenosis. 4. Moderate-severe left foraminal stenosis at C3-4. Moderate bilateral foraminal stenosis at C4-5 and C7-T1. Electronically Signed   By: Duanne Guess D.O.   On: 02/27/2023 20:37    Procedures Procedures    CRITICAL CARE Performed by: Garlon Hatchet   Total critical care time: 45 minutes  Critical care time was exclusive of separately billable procedures and treating other patients.  Critical care was necessary to treat or prevent imminent or life-threatening deterioration.  Critical care was time spent personally by me on the following activities: development of treatment plan with patient and/or surrogate as well as  nursing, discussions with consultants, evaluation of patient's response to treatment, examination of patient, obtaining history from patient or surrogate, ordering and performing treatments and interventions, ordering and review of laboratory studies, ordering and review of radiographic studies, pulse oximetry and re-evaluation of patient's condition.   Medications Ordered in ED Medications  vancomycin (VANCOREADY) IVPB 1250 mg/250 mL (1,250 mg Intravenous New Bag/Given 02/28/23 0154)  HYDROcodone-acetaminophen (NORCO/VICODIN) 5-325 MG per tablet 1 tablet (1 tablet Oral Given 02/27/23 1905)  ketorolac (TORADOL) 15 MG/ML injection 15 mg (15 mg Intramuscular Given 02/27/23 1904)  HYDROmorphone (DILAUDID) injection 1 mg (1 mg Intravenous Given 02/28/23 0104)  ceFEPIme (MAXIPIME) 2 g in sodium chloride 0.9 % 100 mL IVPB (0 g Intravenous Stopped 02/28/23 0153)    ED Course/ Medical Decision Making/ A&P                                 Medical Decision Making Amount and/or Complexity of Data Reviewed Labs: ordered. Radiology: ordered and independent interpretation performed. ECG/medicine tests: ordered and independent interpretation performed.  Risk Prescription drug management. Decision regarding hospitalization.   63 year old male presenting to the ED for neck/upper back pain.  Noticed this after a fall in November, worsening over the past few weeks.  He was seen by outpatient spine specialist and scheduled for MRI but not until February.  Presented here due to worsening pain.  He is afebrile and nontoxic in appearance.  He does have some tenderness along the lower cervical spine on exam but there is no acute deformity.  No swelling or overlying skin changes.  He does not have any focal neurologic deficits on my exam.  MRI was obtained after triage evaluation--concerning for discitis/osteomyelitis from C6-T1.  Also questionable phlegmon on right.  Labs with leukocytosis but no electrolyte  derangement.  Sed rate and CRP are both elevated which correlates to MRI findings.  Blood cultures are pending.  Will start IV antibiotics.  He will require admission.  Spoke with hospitalist, Dr. Docia Chuck admit but requested neurosurgery consult given phlegmon.  3:10 AM Spoke with NP Kim with neurosurgery-- if not having any focal neurologic findings, do not feel repeat MRI or other intervention warranted at this time.  Continue IV abx.  This was relayed to admitting provider.  Final Clinical Impression(s) / ED Diagnoses Final diagnoses:  Discitis of cervical region    Rx / DC Orders ED Discharge Orders     None         Garlon Hatchet, PA-C 02/28/23 0322    Marily Memos, MD 02/28/23 [phone]

## 2023-03-01 DIAGNOSIS — M4642 Discitis, unspecified, cervical region: Secondary | ICD-10-CM | POA: Diagnosis not present

## 2023-03-01 DIAGNOSIS — Z720 Tobacco use: Secondary | ICD-10-CM | POA: Diagnosis not present

## 2023-03-01 DIAGNOSIS — E785 Hyperlipidemia, unspecified: Secondary | ICD-10-CM | POA: Diagnosis not present

## 2023-03-01 LAB — BASIC METABOLIC PANEL
Anion gap: 7 (ref 5–15)
BUN: 11 mg/dL (ref 8–23)
CO2: 25 mmol/L (ref 22–32)
Calcium: 9.3 mg/dL (ref 8.9–10.3)
Chloride: 103 mmol/L (ref 98–111)
Creatinine, Ser: 0.86 mg/dL (ref 0.61–1.24)
GFR, Estimated: >60 mL/min (ref >60)
Glucose, Bld: 111 mg/dL — ABNORMAL HIGH (ref 70–99)
Potassium: 4 mmol/L (ref 3.5–5.1)
Sodium: 135 mmol/L (ref 135–145)

## 2023-03-01 NOTE — Progress Notes (Signed)
PROGRESS NOTE    Elijah Pena  ZOX:[id number] DOB: 1960-04-12 DOA: 02/27/2023 PCP: Alois Cliche, MD  Subjective: Pt seen and examined. Pt c/o of worsening neck pain. Afebrile. Po and iv dilaudid are helping. Awaiting IR to perform disc aspiration vs bone biopsy tomorrow.   Hospital Course: No notes on file   Assessment and Plan: * Cervical discitis 02-28-2023 continue with cefepime/vanco. ID consulted for abx management. IR and neurosurgery consulted. Will rx IV and po dilaudid prn for pain. Pt and dtr warned about addictive potential of dilaudid. Pt states that norco and oxycodone have not helped with his pain. Hold on any chemical DVT prophylaxis until he gets his biopsy/disc aspiration. SCDs for now. I reviewed pt's MRI images with pt's dtr at her request. Permission given by patient to allow dtr to look at his images.  03-01-2023 remains on IV ABX with cefepime/vanco. Awaiting disc aspiration vs bone biopsy tomorrow. Npo after MN. Continue with prn IV dilaudid/po dilaudid. Pt already has prn robaxin ordered. Start bowel program to prevent opioid-induced constipation.  Tobacco abuse 02-28-2023 pt advised to quit. Rx nicotine patch and gum. 03-01-2023 stable  HLD (hyperlipidemia) 02-28-2023 stable 03-01-2023 stable   DVT prophylaxis: SCDs Start: 02/28/23 8119    Code Status: Full Code Family Communication: pt's son at bedside but sleeping in recliner. He did not wake up for pt interview. Disposition Plan: return home Reason for continuing need for hospitalization: remains on IV Abx.  Objective: Vitals:   02/28/23 1653 02/28/23 1946 03/01/23 0450 03/01/23 0745  BP: (!) 150/83 (!) 155/84 (!) 161/87 (!) 145/85  Pulse: 79 96 82 80  Resp: 17 16 18 18   Temp:  98.9 F (37.2 C) 97.9 F (36.6 C)   TempSrc:  Oral Oral   SpO2: 99% 98% 99% 99%  Weight:      Height:        Intake/Output Summary (Last 24 hours) at 03/01/2023 1152 Last data filed at 03/01/2023 0814 Gross  per 24 hour  Intake 667.74 ml  Output --  Net 667.74 ml   Filed Weights   02/27/23 1818  Weight: 60.3 kg    Examination:  Physical Exam Vitals and nursing note reviewed.  Constitutional:      General: He is not in acute distress.    Appearance: He is not toxic-appearing or diaphoretic.     Comments: Appears chronically ill, thin.  HENT:     Head: Normocephalic and atraumatic.     Nose: Nose normal.  Eyes:     General: No scleral icterus. Cardiovascular:     Rate and Rhythm: Normal rate and regular rhythm.  Pulmonary:     Effort: Pulmonary effort is normal.     Breath sounds: Normal breath sounds.  Abdominal:     General: Bowel sounds are normal.     Palpations: Abdomen is soft.  Musculoskeletal:     Right lower leg: No edema.     Left lower leg: No edema.  Skin:    General: Skin is warm and dry.     Capillary Refill: Capillary refill takes less than 2 seconds.  Neurological:     Mental Status: He is alert and oriented to person, place, and time.     Data Reviewed: I have personally reviewed following labs and imaging studies  CBC: Recent Labs  Lab 02/28/23 0100 02/28/23 0656  WBC 13.5* 10.6*  NEUTROABS 9.9* 7.0  HGB 13.6 12.9*  HCT 40.1 37.8*  MCV 87.4 88.1  PLT  312 284   Basic Metabolic Panel: Recent Labs  Lab 02/28/23 0100 02/28/23 0656 03/01/23 0715  NA 134* 135 135  K 3.9 4.2 4.0  CL 99 101 103  CO2 23 24 25   GLUCOSE 112* 132* 111*  BUN 17 17 11   CREATININE 0.95 0.88 0.86  CALCIUM 10.0 9.6 9.3   GFR: Estimated Creatinine Clearance: 74 mL/min (by C-G formula based on SCr of 0.86 mg/dL). Liver Function Tests: Recent Labs  Lab 02/28/23 0100 02/28/23 0656  AST 18 17  ALT 19 17  ALKPHOS 81 75  BILITOT 0.8 0.7  PROT 7.3 6.4*  ALBUMIN 3.7 3.3*    Recent Results (from the past 240 hours)  Blood culture (routine x 2)     Status: None (Preliminary result)   Collection Time: 02/28/23  1:00 AM   Specimen: BLOOD  Result Value Ref Range  Status   Specimen Description BLOOD RIGHT ANTECUBITAL  Final   Special Requests   Final    BOTTLES DRAWN AEROBIC AND ANAEROBIC Blood Culture results may not be optimal due to an inadequate volume of blood received in culture bottles   Culture   Final    NO GROWTH 1 DAY Performed at [facility], [address], [place], [place] [address]    Report Status PENDING  Incomplete  Blood culture (routine x 2)     Status: None (Preliminary result)   Collection Time: 02/28/23  1:00 AM   Specimen: BLOOD  Result Value Ref Range Status   Specimen Description BLOOD BLOOD RIGHT FOREARM  Final   Special Requests   Final    BOTTLES DRAWN AEROBIC AND ANAEROBIC Blood Culture adequate volume   Culture   Final    NO GROWTH 1 DAY Performed at [facility], [address] [place], [place] [address]    Report Status PENDING  Incomplete     Radiology Studies: MR Cervical Spine Wo Contrast Result Date: 02/27/2023 CLINICAL DATA:  Neck pain, chronic, degenerative changes on xray. Right arm weakness EXAM: MRI CERVICAL SPINE WITHOUT CONTRAST TECHNIQUE: Multiplanar, multisequence MR imaging of the cervical spine was performed. No intravenous contrast was administered. COMPARISON:  None Available. FINDINGS: Technical Note: Despite efforts by the technologist and patient, motion artifact is present on today's exam and could not be eliminated. This reduces exam sensitivity and specificity. Alignment: Trace retrolisthesis at C5-6. Vertebrae: Bone marrow edema throughout the C6 and C7 vertebral bodies with confluent low T1 marrow signal changes. Marrow edema extends to the right C7 pedicle. Patchy bone marrow edema is also present within the T1 vertebral body anteriorly. No fluid signal within the intervertebral discs. Subchondral marrow edema associated with the right C7-T1 facet joint. There is height loss of the C7 vertebral body, age indeterminate. Cord: No convincing site of cervical cord signal abnormality  on motion degraded images. Posterior Fossa, vertebral arteries, paraspinal tissues: T2/STIR hyperintense signal in the prevertebral soft tissues anterior to the lower cervical spine centered at C7, more pronounced on the right (series 7, image 3). Disc levels: C2-C3: No significant disc protrusion, foraminal stenosis, or canal stenosis. C3-C4: Disc osteophyte complex with left greater than right facet and uncovertebral arthropathy. Moderate-severe left and mild right foraminal stenosis. No canal stenosis. C4-C5: Disc osteophyte complex with facet and uncovertebral arthropathy. Moderate bilateral foraminal stenosis, right worse than left. No canal stenosis. C5-C6: Disc osteophyte complex with bilateral facet and uncovertebral arthropathy. Moderate canal stenosis with severe bilateral foraminal stenosis. C6-C7: Disc osteophyte complex with  facet and uncovertebral arthropathy. Mild canal stenosis with severe bilateral foraminal stenosis. C7-T1: No disc protrusion. Bilateral facet arthropathy. Borderline-mild canal stenosis. Moderate bilateral foraminal stenosis, worse on the right. IMPRESSION: 1. Bone marrow edema throughout the C6 and C7 vertebral bodies with confluent low T1 marrow signal changes. Patchy bone marrow edema is also present within the T1 vertebral body anteriorly. 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 Septic arthritis of the right C7-T1 facet joint is also a possibility. Correlation with ESR and CRP is recommended. In the absence of signs of systemic infection/inflammation, a infiltrative marrow replacing neoplasm should be considered. 2. Prevertebral soft tissue edema anterior to the lower cervical spine centered at C7, more pronounced on the right, favoring prevertebral phlegmon. 3. Multilevel cervical spondylosis, most pronounced at C5-6 where there is moderate canal stenosis and severe bilateral foraminal stenosis. 4. Moderate-severe left foraminal stenosis at C3-4. Moderate  bilateral foraminal stenosis at C4-5 and C7-T1. Electronically Signed   By: Duanne Guess D.O.   On: 02/27/2023 20:37    Scheduled Meds:  docusate sodium  200 mg Oral BID   nicotine  14 mg Transdermal Daily   Continuous Infusions:  ceFEPime (MAXIPIME) IV 2 g (03/01/23 0814)   vancomycin 750 mg (03/01/23 0904)     LOS: 1 day   Time spent: 40 minutes  Carollee Herter, DO  [facility]  03/01/2023, 11:52 AM

## 2023-03-01 NOTE — Plan of Care (Signed)

## 2023-03-02 ENCOUNTER — Other Ambulatory Visit: Payer: Self-pay

## 2023-03-02 DIAGNOSIS — M4642 Discitis, unspecified, cervical region: Secondary | ICD-10-CM | POA: Diagnosis not present

## 2023-03-02 DIAGNOSIS — Z720 Tobacco use: Secondary | ICD-10-CM | POA: Diagnosis not present

## 2023-03-02 DIAGNOSIS — M4622 Osteomyelitis of vertebra, cervical region: Secondary | ICD-10-CM | POA: Diagnosis not present

## 2023-03-02 DIAGNOSIS — E785 Hyperlipidemia, unspecified: Secondary | ICD-10-CM | POA: Diagnosis not present

## 2023-03-02 LAB — CBC WITH DIFFERENTIAL/PLATELET
Abs Immature Granulocytes: 0.04 10*3/uL (ref 0.00–0.07)
Basophils Absolute: 0.1 10*3/uL (ref 0.0–0.1)
Basophils Relative: 1 %
Eosinophils Absolute: 0.1 10*3/uL (ref 0.0–0.5)
Eosinophils Relative: 1 %
HCT: 38.3 % — ABNORMAL LOW (ref 39.0–52.0)
Hemoglobin: 12.9 g/dL — ABNORMAL LOW (ref 13.0–17.0)
Immature Granulocytes: 0 %
Lymphocytes Relative: 13 %
Lymphs Abs: 1.4 10*3/uL (ref 0.7–4.0)
MCH: 29.7 pg (ref 26.0–34.0)
MCHC: 33.7 g/dL (ref 30.0–36.0)
MCV: 88 fL (ref 80.0–100.0)
Monocytes Absolute: 1.1 10*3/uL — ABNORMAL HIGH (ref 0.1–1.0)
Monocytes Relative: 10 %
Neutro Abs: 7.6 10*3/uL (ref 1.7–7.7)
Neutrophils Relative %: 75 %
Platelets: 287 10*3/uL (ref 150–400)
RBC: 4.35 MIL/uL (ref 4.22–5.81)
RDW: 12 % (ref 11.5–15.5)
WBC: 10.3 10*3/uL (ref 4.0–10.5)
nRBC: 0 % (ref 0.0–0.2)

## 2023-03-02 LAB — BASIC METABOLIC PANEL
Anion gap: 12 (ref 5–15)
BUN: 9 mg/dL (ref 8–23)
CO2: 23 mmol/L (ref 22–32)
Calcium: 9.4 mg/dL (ref 8.9–10.3)
Chloride: 100 mmol/L (ref 98–111)
Creatinine, Ser: 0.73 mg/dL (ref 0.61–1.24)
GFR, Estimated: >60 mL/min (ref >60)
Glucose, Bld: 117 mg/dL — ABNORMAL HIGH (ref 70–99)
Potassium: 3.9 mmol/L (ref 3.5–5.1)
Sodium: 135 mmol/L (ref 135–145)

## 2023-03-02 LAB — PROTIME-INR
INR: 1.1 (ref 0.8–1.2)
Prothrombin Time: 14.3 s (ref 11.4–15.2)

## 2023-03-02 MED ORDER — DAPTOMYCIN IV (FOR PTA / DISCHARGE USE ONLY): 500.0000 mg | INTRAVENOUS | 0 refills | Status: AC

## 2023-03-02 MED ORDER — SODIUM CHLORIDE 0.9 % IV SOLN
2.0000 g | INTRAVENOUS | Status: DC
  Administered 2023-03-02 – 2023-03-03 (×2): 2 g via INTRAVENOUS
  Filled 2023-03-02 (×2): qty 20

## 2023-03-02 MED ORDER — SODIUM CHLORIDE 0.9% FLUSH
10.0000 mL | Freq: Two times a day (BID) | INTRAVENOUS | Status: DC
  Administered 2023-03-03 (×2): 10 mL

## 2023-03-02 MED ORDER — HEPARIN SODIUM (PORCINE) 5000 UNIT/ML IJ SOLN
5000.0000 [IU] | Freq: Three times a day (TID) | INTRAMUSCULAR | Status: DC
  Administered 2023-03-02 – 2023-03-03 (×4): 5000 [IU] via SUBCUTANEOUS
  Filled 2023-03-02 (×4): qty 1

## 2023-03-02 MED ORDER — CHLORHEXIDINE GLUCONATE CLOTH 2 % EX PADS
6.0000 | MEDICATED_PAD | Freq: Every day | CUTANEOUS | Status: DC
  Administered 2023-03-03 (×2): 6 via TOPICAL

## 2023-03-02 MED ORDER — SODIUM CHLORIDE 0.9% FLUSH
10.0000 mL | INTRAVENOUS | Status: DC | PRN
Start: 2023-03-02 — End: 2023-03-03

## 2023-03-02 MED ORDER — DAPTOMYCIN-SODIUM CHLORIDE 500-0.9 MG/50ML-% IV SOLN
8.0000 mg/kg | Freq: Every day | INTRAVENOUS | Status: DC
  Administered 2023-03-02 – 2023-03-03 (×2): 500 mg via INTRAVENOUS
  Filled 2023-03-02 (×2): qty 50

## 2023-03-02 MED ORDER — CEFTRIAXONE IV (FOR PTA / DISCHARGE USE ONLY): 2.0000 g | INTRAVENOUS | 0 refills | Status: AC

## 2023-03-02 NOTE — Progress Notes (Addendum)
PHARMACY CONSULT NOTE FOR:  OUTPATIENT  PARENTERAL ANTIBIOTIC THERAPY (OPAT)  Indication: Osteomyelitis/Discitis Regimen: Daptomycin 500 mg IV daily + ceftriaxone 2 g IV daily End date: 04/09/23  IV antibiotic discharge orders are pended. To discharging provider:  please sign these orders via discharge navigator,  Select New Orders & click on the button choice - Manage This Unsigned Work.     Thank you for allowing pharmacy to be a part of this patient's care.  Elijah Pena 03/02/2023, 12:50 PM

## 2023-03-02 NOTE — Plan of Care (Signed)

## 2023-03-02 NOTE — Progress Notes (Signed)
Transition of Care [facility]) - Inpatient Brief Assessment   Patient Details  Name: Elijah Pena MRN: [id number] Date of Birth: 01-17-1961  [facility] CM/SW Contact:    Janae Bridgeman, RN Phone Number: 03/02/2023, 3:30 PM   Clinical Narrative: CM met with the patient at the bedside to discuss TOC needs to return home with home health services.  Patient plans to discharge to his son's home - Elijah Pena - [phone] at [address], [place], [place] [address].  I called and spoke with the patient's son by phone and he is aware that [employer] company will likely call him to arrange teaching regarding IV infusions and plans to deliver the Antibiotics to the home.  Patient did not have a preference for home health company.  Patient has [employer] benefits.  I called admitting and asked that insurance provider be added to the patient's chart.  Patient will likely has PICC line placed today and plan for discharge to home tomorrow - once [employer] has coordinated medications/ teaching - pending at this time.  Patient will likely need HH RN through [employer] company.  Patient plans to quit smoking - resources provided.  No other TOC needs and patient/ family are aware of likely discharge to home tomorrow - once home health has been arranged.   Transition of Care Asessment: Insurance and Status: Insurance coverage has been reviewed Patient has primary care physician: Yes Home environment has been reviewed: (P) from home with son - plans to discharge to oldest son's home - Elijah Pena - [phone] Prior level of function:: (P) family assistance Prior/Current Home Services: (P) No current home services Social Drivers of Health Review: (P) SDOH reviewed interventions complete Readmission risk has been reviewed: (P) Yes Transition of care needs: (P) transition of care needs identified, TOC will continue to follow

## 2023-03-02 NOTE — Progress Notes (Signed)
[facility]   Reason for visit: Follow up on cervical osteomyelitis  Interval History: Continues to complain of pain but no worsening.  WBC of 10.3.  He remains afebrile.   Physical Exam: Constitutional:  Vitals:   03/02/23 0304 03/02/23 0903  BP: (!) 152/71 129/87  Pulse: 93 85  Resp: 18 18  Temp: 97.7 F (36.5 C) 98 F (36.7 C)  SpO2: 98% 98%   patient appears in NAD Respiratory: Normal respiratory effort  Review of Systems: Constitutional: negative for fevers and chills  Lab Results  Component Value Date   WBC 10.3 03/02/2023   HGB 12.9 (L) 03/02/2023   HCT 38.3 (L) 03/02/2023   MCV 88.0 03/02/2023   PLT 287 03/02/2023    Lab Results  Component Value Date   CREATININE 0.73 03/02/2023   BUN 9 03/02/2023   NA 135 03/02/2023   K 3.9 03/02/2023   CL 100 03/02/2023   CO2 23 03/02/2023    Lab Results  Component Value Date   ALT 17 02/28/2023   AST 17 02/28/2023   ALKPHOS 75 02/28/2023     Microbiology: Recent Results (from the past 240 hours)  Blood culture (routine x 2)     Status: None (Preliminary result)   Collection Time: 02/28/23  1:00 AM   Specimen: BLOOD  Result Value Ref Range Status   Specimen Description BLOOD RIGHT ANTECUBITAL  Final   Special Requests   Final    BOTTLES DRAWN AEROBIC AND ANAEROBIC Blood Culture results may not be optimal due to an inadequate volume of blood received in culture bottles   Culture   Final    NO GROWTH 2 DAYS Performed at [facility], [address], [place], [place] [address]    Report Status PENDING  Incomplete  Blood culture (routine x 2)     Status: None (Preliminary result)   Collection Time: 02/28/23  1:00 AM   Specimen: BLOOD  Result Value Ref Range Status   Specimen Description BLOOD BLOOD RIGHT FOREARM  Final   Special Requests   Final    BOTTLES DRAWN AEROBIC AND ANAEROBIC Blood Culture adequate volume   Culture   Final    NO GROWTH 2 DAYS Performed at [facility], [address] [place], [place] [address]    Report Status PENDING  Incomplete    Impression/Plan:  1.  Cervical thoracic osteomyelitis/discitis.  Also noted some septic arthritis of the right C7-T1 facet joint.  Sed rate mildly elevated in the 50s and CRP is 2.1.  Not overly impressive for infection with the inflammatory markers however with diffuse bone marrow edema certainly concerning for that and I do recommend he continue with antibiotic treatment.  Interventional radiology has been consulted but do not feel there is adequate window to get an appropriate sample therefore we will treat empirically.  Blood cultures are negative to date. Lan to treat for 6 weeks with daptomycin and ceftriaxone and this has been started today.   2.  Access.  As above, plan for long-term IV antibiotics and with no particular organism, I do recommend empiric daptomycin and ceftriaxone.  Will plan for 6 weeks with this depending on clinical course.  This was discussed with the patient  3.  OPAT.  As above will plan on 6 weeks and will place OPAT now.  Diagnosis: Cervicothoracic discitis/osteomyelitis  Culture Result: no culture  Allergies  Allergen Reactions   Wellbutrin [Bupropion] Anaphylaxis  Nicoderm [Nicotine] Rash    OPAT Orders Discharge antibiotics to be given via PICC line Discharge antibiotics: Daptomycin 8mg /kg once daily IV + ceftriaxone 2 grams IV once daily Per pharmacy protocol yes Duration: 6 weeks End Date: Februray 27  [facility] Care Per Protocol: yes  Home health RN for IV administration and teaching; PICC line care and labs.    Labs weekly while on IV antibiotics: _x_ CBC with differential __ BMP _x_ CMP _x_ CRP _x_ ESR __ Vancomycin trough _x_ CK  __ Please pull PIC at completion of IV antibiotics __x Please leave PIC in place until doctor has seen patient or been notified  Fax weekly labs to [phone]  Clinic Follow Up Appt: 2/7 with Dr.  Luciana Axe  @ 9:45 am

## 2023-03-02 NOTE — Progress Notes (Signed)
PROGRESS NOTE    Elijah Pena  ZOX:[id number] DOB: 1960-08-27 DOA: 02/27/2023 PCP: Alois Cliche, MD  Subjective: Pt seen and examined. Communication with IR APP today. No accessible window for obtaining bone biopsy. No fluid available for aspiration.  ID is aware.  Pt states that po robaxin has helped a lot with numbness/parasthesias. Now only intermittent instead of constant.  ID to address IV ABX and duration. Blood cx negative for 2 days.   Hospital Course: HPI: Elijah Pena is a 63 y.o. male with history of tobacco abuse had a fall in November 2024 since then has been hurting in his neck.  Pain has been gradually worsening radiating to both his arms.  About 10 days ago patient had followed up with spine surgeons in [facility].  Plan was to get MRI of the C-spine but due to worsening pain patient presents to the ER.  Denies any incontinence of urine or bowel.  Denies any fever chills.   ED Course: In the ER patient appears nonfocal.  MRI of the C-spine shows concerning features for discitis/osteomyelitis involving the C6-C7 and C7 and T1 areas and also prevertebral phlegmon involving the C7 area.  On-call neurosurgery was consulted and requested medical management at this time.  Blood cultures obtained patient was started on vancomycin and cefepime by the ER physician.  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 Patchy bone marrow edema is also present within the T1 vertebral body anteriorly. 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 Septic arthritis of the right C7-T1 facet joint is also a possibility. Correlation with ESR and CRP is recommended. In the absence of signs of systemic infection/inflammation, a infiltrative  marrow replacing neoplasm should be considered. 2. Prevertebral soft tissue edema anterior to the lower cervical spine centered at C7, more pronounced on the right, favoring prevertebral phlegmon. 3. Multilevel cervical spondylosis, most pronounced at C5-6 where there is moderate canal stenosis and severe bilateral foraminal stenosis. 4. Moderate-severe left foraminal stenosis at C3-4. Moderate bilateral foraminal stenosis at C4-5 and C7-T1.  Antibiotic Therapy: Anti-infectives (From admission, onward)    Start     Dose/Rate Route Frequency Ordered Stop   03/02/23 1600  cefTRIAXone (ROCEPHIN) 2 g in sodium chloride 0.9 % 100 mL IVPB        2 g 200 mL/hr over 30 Minutes Intravenous Every 24 hours 03/02/23 1021     03/02/23 1130  DAPTOmycin (CUBICIN) IVPB 500 mg/67mL premix        8 mg/kg  60.3 kg 100 mL/hr over 30 Minutes Intravenous Daily 03/02/23 1021     03/01/23 0030  vancomycin (VANCOCIN) 750 mg in sodium chloride 0.9 % 250 mL IVPB  Status:  Discontinued        750 mg 250 mL/hr over 60 Minutes Intravenous Every 12 hours 02/28/23 2026 03/02/23 1021   02/28/23 1230  vancomycin (VANCOCIN) 750 mg in sodium chloride 0.9 % 250 mL IVPB  Status:  Discontinued        750 mg 265 mL/hr over 60 Minutes Intravenous Every 12 hours 02/28/23 0757 02/28/23 2025   02/28/23 1200  vancomycin (VANCOREADY) IVPB 750 mg/150 mL  Status:  Discontinued        750 mg 150 mL/hr over 60 Minutes Intravenous Every 12 hours 02/28/23  6295 02/28/23 0757   02/28/23 0800  ceFEPIme (MAXIPIME) 2 g in sodium chloride 0.9 % 100 mL IVPB  Status:  Discontinued        2 g 200 mL/hr over 30 Minutes Intravenous Every 8 hours 02/28/23 0747 03/02/23 1021   02/28/23 0045  vancomycin (VANCOREADY) IVPB 1250 mg/250 mL        1,250 mg 166.7 mL/hr over 90 Minutes Intravenous  Once 02/28/23 0036 02/28/23 0330   02/28/23 0045  ceFEPIme (MAXIPIME) 2 g in sodium chloride 0.9 % 100 mL IVPB        2 g 200 mL/hr over 30 Minutes Intravenous   Once 02/28/23 0036 02/28/23 0153       Procedures:   Consultants: ID Neurology IR    Assessment and Plan: * Cervical discitis 02-28-2023 continue with cefepime/vanco. ID consulted for abx management. IR and neurosurgery consulted. Will rx IV and po dilaudid prn for pain. Pt and dtr warned about addictive potential of dilaudid. Pt states that norco and oxycodone have not helped with his pain. Hold on any chemical DVT prophylaxis until he gets his biopsy/disc aspiration. SCDs for now. I reviewed pt's MRI images with pt's dtr at her request. Permission given by patient to allow dtr to look at his images. 03-01-2023 remains on IV ABX with cefepime/vanco. Awaiting disc aspiration vs bone biopsy tomorrow. Npo after MN. Continue with prn IV dilaudid/po dilaudid. Pt already has prn robaxin ordered. Start bowel program to prevent opioid-induced constipation.  03-02-2023 no window for bone culture or aspiration. Pt with carotid artery in the way of any possible biopsy pathway.  IR not going to proceed with any procedure. ID aware. Continue with po robaxin as needed.  Tobacco abuse 02-28-2023 pt advised to quit. Rx nicotine patch and gum. 03-01-2023 stable  HLD (hyperlipidemia) 02-28-2023 stable 03-01-2023 stable   DVT prophylaxis: heparin injection 5,000 Units Start: 03/02/23 1415 SCDs Start: 02/28/23 0612  SQ Heparin   Code Status: Full Code Family Communication: discussed with pt and pt's son at bedside Disposition Plan: return home Reason for continuing need for hospitalization: ID to decide on IV abx, duration. Still need PICC line.  Objective: Vitals:   03/01/23 2106 03/02/23 0019 03/02/23 0304 03/02/23 0903  BP: (!) 154/99 (!) 164/84 (!) 152/71 129/87  Pulse: 88 88 93 85  Resp: 19 19 18 18   Temp: 98.5 F (36.9 C) 99 F (37.2 C) 97.7 F (36.5 C) 98 F (36.7 C)  TempSrc: Oral Oral Oral   SpO2: 98% 98% 98% 98%  Weight:      Height:        Intake/Output Summary (Last  24 hours) at 03/02/2023 1329 Last data filed at 03/02/2023 0816 Gross per 24 hour  Intake 2124.91 ml  Output 1000 ml  Net 1124.91 ml   Filed Weights   02/27/23 1818  Weight: 60.3 kg    Examination:  Physical Exam Vitals and nursing note reviewed.  Constitutional:      General: He is not in acute distress.    Appearance: He is not toxic-appearing or diaphoretic.  HENT:     Head: Normocephalic and atraumatic.     Nose: Nose normal.  Eyes:     General: No scleral icterus. Cardiovascular:     Rate and Rhythm: Normal rate and regular rhythm.  Pulmonary:     Effort: Pulmonary effort is normal.     Breath sounds: Normal breath sounds.  Abdominal:     General: Abdomen is flat. Bowel  sounds are normal.     Palpations: Abdomen is soft.  Musculoskeletal:     Right lower leg: No edema.     Left lower leg: No edema.  Skin:    General: Skin is warm and dry.     Capillary Refill: Capillary refill takes less than 2 seconds.  Neurological:     Mental Status: He is alert and oriented to person, place, and time.     Data Reviewed: I have personally reviewed following labs and imaging studies  CBC: Recent Labs  Lab 02/28/23 0100 02/28/23 0656 03/02/23 0549  WBC 13.5* 10.6* 10.3  NEUTROABS 9.9* 7.0 7.6  HGB 13.6 12.9* 12.9*  HCT 40.1 37.8* 38.3*  MCV 87.4 88.1 88.0  PLT 312 284 287   Basic Metabolic Panel: Recent Labs  Lab 02/28/23 0100 02/28/23 0656 03/01/23 0715 03/02/23 0549  NA 134* 135 135 135  K 3.9 4.2 4.0 3.9  CL 99 101 103 100  CO2 23 24 25 23   GLUCOSE 112* 132* 111* 117*  BUN 17 17 11 9   CREATININE 0.95 0.88 0.86 0.73  CALCIUM 10.0 9.6 9.3 9.4   GFR: Estimated Creatinine Clearance: 79.6 mL/min (by C-G formula based on SCr of 0.73 mg/dL). Liver Function Tests: Recent Labs  Lab 02/28/23 0100 02/28/23 0656  AST 18 17  ALT 19 17  ALKPHOS 81 75  BILITOT 0.8 0.7  PROT 7.3 6.4*  ALBUMIN 3.7 3.3*   Coagulation Profile: Recent Labs  Lab  03/02/23 0549  INR 1.1    Recent Results (from the past 240 hours)  Blood culture (routine x 2)     Status: None (Preliminary result)   Collection Time: 02/28/23  1:00 AM   Specimen: BLOOD  Result Value Ref Range Status   Specimen Description BLOOD RIGHT ANTECUBITAL  Final   Special Requests   Final    BOTTLES DRAWN AEROBIC AND ANAEROBIC Blood Culture results may not be optimal due to an inadequate volume of blood received in culture bottles   Culture   Final    NO GROWTH 2 DAYS Performed at [facility], [address] [place], [place] [address]    Report Status PENDING  Incomplete  Blood culture (routine x 2)     Status: None (Preliminary result)   Collection Time: 02/28/23  1:00 AM   Specimen: BLOOD  Result Value Ref Range Status   Specimen Description BLOOD BLOOD RIGHT FOREARM  Final   Special Requests   Final    BOTTLES DRAWN AEROBIC AND ANAEROBIC Blood Culture adequate volume   Culture   Final    NO GROWTH 2 DAYS Performed at [facility], [address], [place], [place] [address]    Report Status PENDING  Incomplete     Radiology Studies: No results found.  Scheduled Meds:  docusate sodium  200 mg Oral BID   heparin injection (subcutaneous)  5,000 Units Subcutaneous Q8H   nicotine  14 mg Transdermal Daily   Continuous Infusions:  cefTRIAXone (ROCEPHIN)  IV     DAPTOmycin       LOS: 2 days   Time spent: 40 minutes  Carollee Herter, DO  [facility]  03/02/2023, 1:29 PM

## 2023-03-02 NOTE — Hospital Course (Addendum)
HPI: Elijah Pena is a 63 y.o. male with history of tobacco abuse had a fall in November 2024 since then has been hurting in his neck.  Pain has been gradually worsening radiating to both his arms.  About 10 days ago patient had followed up with spine surgeons in [facility].  Plan was to get MRI of the C-spine but due to worsening pain patient presents to the ER.  Denies any incontinence of urine or bowel.  Denies any fever chills.   ED Course: In the ER patient appears nonfocal.  MRI of the C-spine shows concerning features for discitis/osteomyelitis involving the C6-C7 and C7 and T1 areas and also prevertebral phlegmon involving the C7 area.  On-call neurosurgery was consulted and requested medical management at this time.  Blood cultures obtained patient was started on vancomycin and cefepime by the ER physician.  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 Patchy bone marrow edema is also present within the T1 vertebral body anteriorly. 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 Septic arthritis of the right C7-T1 facet joint is also a possibility. Correlation with ESR and CRP is recommended. In the absence of signs of systemic infection/inflammation, a infiltrative marrow replacing neoplasm should be considered. 2. Prevertebral soft tissue edema anterior to the lower cervical spine centered at C7, more pronounced on the right, favoring prevertebral phlegmon. 3. Multilevel cervical spondylosis, most pronounced at C5-6 where there is moderate canal stenosis and severe bilateral foraminal stenosis. 4. Moderate-severe left foraminal stenosis at C3-4. Moderate bilateral foraminal stenosis at C4-5 and C7-T1.  Antibiotic Therapy: Anti-infectives  (From admission, onward)    Start     Dose/Rate Route Frequency Ordered Stop   03/02/23 1600  cefTRIAXone (ROCEPHIN) 2 g in sodium chloride 0.9 % 100 mL IVPB        2 g 200 mL/hr over 30 Minutes Intravenous Every 24 hours 03/02/23 1021     03/02/23 1130  DAPTOmycin (CUBICIN) IVPB 500 mg/77mL premix        8 mg/kg  60.3 kg 100 mL/hr over 30 Minutes Intravenous Daily 03/02/23 1021     03/01/23 0030  vancomycin (VANCOCIN) 750 mg in sodium chloride 0.9 % 250 mL IVPB  Status:  Discontinued        750 mg 250 mL/hr over 60 Minutes Intravenous Every 12 hours 02/28/23 2026 03/02/23 1021   02/28/23 1230  vancomycin (VANCOCIN) 750 mg in sodium chloride 0.9 % 250 mL IVPB  Status:  Discontinued        750 mg 265 mL/hr over 60 Minutes Intravenous Every 12 hours 02/28/23 0757 02/28/23 2025   02/28/23 1200  vancomycin (VANCOREADY) IVPB 750 mg/150 mL  Status:  Discontinued        750 mg 150 mL/hr over 60 Minutes Intravenous Every 12 hours 02/28/23 0747 02/28/23 0757   02/28/23 0800  ceFEPIme (MAXIPIME) 2 g in sodium chloride 0.9 % 100 mL IVPB  Status:  Discontinued        2 g 200 mL/hr over 30 Minutes Intravenous Every 8 hours 02/28/23 0747 03/02/23 1021   02/28/23 0045  vancomycin (VANCOREADY) IVPB 1250 mg/250 mL        1,250 mg 166.7 mL/hr over 90 Minutes Intravenous  Once 02/28/23 0036 02/28/23 0330   02/28/23 0045  ceFEPIme (MAXIPIME) 2 g in sodium chloride 0.9 % 100 mL IVPB        2 g 200 mL/hr over 30 Minutes Intravenous  Once 02/28/23 0036 02/28/23 0153       Procedures: 03-02-2023 PICC placement.  Consultants: ID Neurology IR

## 2023-03-02 NOTE — Plan of Care (Signed)
Request to IR for possible C6-C7 disc aspiration/bone biopsy or C7-T1 facet joint aspiration.  Patient history and imaging review by Dr Tommi Rumps Melchor Amour who notes that there is no signal abnormality in the disc at C6-C7 which would be difficult to get to anyway given it's location and is not recommended at this time. There is marrow edema in the bone and articular process of the facet but there is no facet fluid which could be aspirated. There is pre-vertebral phlegmon which is inaccessible as it is in front of the spine, vertebral arteries on each side and anteriorly there is the carotid artery and trachea. This would require pushing the carotid artery out of the way in order to potentially access this location which is a substantial risk to the patient and is not something we would do as there is no defined collection and the procedure is considered low yield. The vertebral body is also inaccessible for the same reasons.  As such, no plans for procedure in IR. Discussed above with primary team/ID.   Consult order will be cancelled - please call with further questions or concerns.  Lynnette Caffey, PA-C

## 2023-03-02 NOTE — Progress Notes (Signed)
Peripherally Inserted Central Catheter Placement  The IV Nurse has discussed with the patient and/or persons authorized to consent for the patient, the purpose of this procedure and the potential benefits and risks involved with this procedure.  The benefits include less needle sticks, lab draws from the catheter, and the patient may be discharged home with the catheter. Risks include, but not limited to, infection, bleeding, blood clot (thrombus formation), and puncture of an artery; nerve damage and irregular heartbeat and possibility to perform a PICC exchange if needed/ordered by physician.  Alternatives to this procedure were also discussed.  Bard Power PICC patient education guide, fact sheet on infection prevention and patient information card has been provided to patient /or left at bedside.  PICC inserted by Annett Fabian, RN  PICC Placement Documentation  PICC Single Lumen 03/02/23 Right Brachial 38 cm 0 cm (Active)  Indication for Insertion or Continuance of Line Home intravenous therapies (PICC only) 03/02/23 1846  Exposed Catheter (cm) 0 cm 03/02/23 1846  Site Assessment Clean, Dry, Intact 03/02/23 1846  Line Status Flushed;Saline locked;Blood return noted 03/02/23 1846  Dressing Type Transparent;Securing device 03/02/23 1846  Dressing Status Antimicrobial disc/dressing in place;Clean, Dry, Intact 03/02/23 1846  Line Care Connections checked and tightened 03/02/23 1846  Line Adjustment (NICU/IV Team Only) No 03/02/23 1846  Dressing Intervention New dressing;Adhesive placed at insertion site (IV team only) 03/02/23 1846  Dressing Change Due 03/09/23 03/02/23 1846       Jaxyn Mestas, Lajean Manes 03/02/2023, 6:47 PM

## 2023-03-03 DIAGNOSIS — M4642 Discitis, unspecified, cervical region: Secondary | ICD-10-CM | POA: Diagnosis not present

## 2023-03-03 DIAGNOSIS — E785 Hyperlipidemia, unspecified: Secondary | ICD-10-CM | POA: Diagnosis not present

## 2023-03-03 DIAGNOSIS — Z720 Tobacco use: Secondary | ICD-10-CM | POA: Diagnosis not present

## 2023-03-03 LAB — BASIC METABOLIC PANEL
Anion gap: 8 (ref 5–15)
BUN: 11 mg/dL (ref 8–23)
CO2: 26 mmol/L (ref 22–32)
Calcium: 9.3 mg/dL (ref 8.9–10.3)
Chloride: 102 mmol/L (ref 98–111)
Creatinine, Ser: 0.71 mg/dL (ref 0.61–1.24)
GFR, Estimated: >60 mL/min (ref >60)
Glucose, Bld: 103 mg/dL — ABNORMAL HIGH (ref 70–99)
Potassium: 3.8 mmol/L (ref 3.5–5.1)
Sodium: 136 mmol/L (ref 135–145)

## 2023-03-03 LAB — CK: Total CK: 46 U/L — ABNORMAL LOW (ref 49–397)

## 2023-03-03 MED ORDER — METHOCARBAMOL 750 MG PO TABS: 750.0000 mg | ORAL_TABLET | Freq: Four times a day (QID) | ORAL | 0 refills | Status: AC | PRN

## 2023-03-03 MED ORDER — HYDROMORPHONE HCL 2 MG PO TABS: 2.0000 mg | ORAL_TABLET | ORAL | 0 refills | Status: AC | PRN

## 2023-03-03 MED ORDER — NALOXONE HCL 0.4 MG/ML IJ SOLN: 0.4000 mg | INTRAMUSCULAR | Status: DC | PRN

## 2023-03-03 NOTE — Discharge Summary (Signed)
[facility] Physician Discharge Summary   Patient name: Elijah Pena  Admit date:     02/27/2023  Discharge date: 03/03/2023  Attending Physician: Elijah Pena [[id number]]  Discharge Physician: Elijah Pena   PCP: Elijah Cliche, MD  Admitted From: Home  Disposition:  Home  Recommendations for Outpatient Follow-up:  Follow up with PCP in 1-2 weeks Follow up with Infectious Disease Follow up with [facility]  Home Health:Yes. RN Equipment/Devices: PICC LINE   Date Placed: 03-02-2023  Discharge Condition:Stable CODE STATUS:FULL Diet recommendation: Heart Healthy Fluid Restriction: None  Hospital Summary: HPI: Elijah Pena is a 63 y.o. male with history of tobacco abuse had a fall in November 2024 since then has been hurting in his neck.  Pain has been gradually worsening radiating to both his arms.  About 10 days ago patient had followed up with spine surgeons in [facility].  Plan was to get MRI of the C-spine but due to worsening pain patient presents to the ER.  Denies any incontinence of urine or bowel.  Denies any fever chills.   ED Course: In the ER patient appears nonfocal.  MRI of the C-spine shows concerning features for discitis/osteomyelitis involving the C6-C7 and C7 and T1 areas and also prevertebral phlegmon involving the C7 area.  On-call neurosurgery was consulted and requested medical management at this time.  Blood cultures obtained patient was started on vancomycin and cefepime by the ER physician.  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 Patchy bone marrow edema is also present within the T1 vertebral body anteriorly. 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 Septic  arthritis of the right C7-T1 facet joint is also a possibility. Correlation with ESR and CRP is recommended. In the absence of signs of systemic infection/inflammation, a infiltrative marrow replacing neoplasm should be considered. 2. Prevertebral soft tissue edema anterior to the lower cervical spine centered at C7, more pronounced on the right, favoring prevertebral phlegmon. 3. Multilevel cervical spondylosis, most pronounced at C5-6 where there is moderate canal stenosis and severe bilateral foraminal stenosis. 4. Moderate-severe left foraminal stenosis at C3-4. Moderate bilateral foraminal stenosis at C4-5 and C7-T1.  Antibiotic Therapy: Anti-infectives (From admission, onward)    Start     Dose/Rate Route Frequency Ordered Stop   03/02/23 1600  cefTRIAXone (ROCEPHIN) 2 g in sodium chloride 0.9 % 100 mL IVPB        2 g 200 mL/hr over 30 Minutes Intravenous Every 24 hours 03/02/23 1021     03/02/23 1130  DAPTOmycin (CUBICIN) IVPB 500 mg/26mL premix        8 mg/kg  60.3 kg 100 mL/hr over 30 Minutes Intravenous Daily 03/02/23 1021     03/01/23 0030  vancomycin (VANCOCIN) 750 mg in sodium chloride 0.9 % 250 mL IVPB  Status:  Discontinued        750 mg 250 mL/hr over 60 Minutes Intravenous Every 12 hours 02/28/23 2026 03/02/23 1021   02/28/23 1230  vancomycin (VANCOCIN) 750 mg in sodium chloride 0.9 % 250 mL IVPB  Status:  Discontinued        750 mg 265 mL/hr over 60 Minutes Intravenous Every 12 hours 02/28/23 0757 02/28/23 2025   02/28/23 1200  vancomycin (VANCOREADY) IVPB 750 mg/150 mL  Status:  Discontinued        750 mg 150 mL/hr over 60 Minutes Intravenous Every 12 hours 02/28/23 0747 02/28/23 0757   02/28/23 0800  ceFEPIme (MAXIPIME) 2 g in sodium chloride 0.9 % 100 mL IVPB  Status:  Discontinued        2 g 200 mL/hr over 30 Minutes Intravenous Every 8 hours 02/28/23 0747 03/02/23 1021   02/28/23 0045  vancomycin (VANCOREADY) IVPB 1250 mg/250 mL        1,250 mg 166.7 mL/hr over 90  Minutes Intravenous  Once 02/28/23 0036 02/28/23 0330   02/28/23 0045  ceFEPIme (MAXIPIME) 2 g in sodium chloride 0.9 % 100 mL IVPB        2 g 200 mL/hr over 30 Minutes Intravenous  Once 02/28/23 0036 02/28/23 0153       Procedures:   Consultants: ID Neurology IR   Hospital Course by Problem: * Cervical discitis 02-28-2023 continue with cefepime/vanco. ID consulted for abx management. IR and neurosurgery consulted. Will rx IV and po dilaudid prn for pain. Pt and dtr warned about addictive potential of dilaudid. Pt states that norco and oxycodone have not helped with his pain. Hold on any chemical DVT prophylaxis until he gets his biopsy/disc aspiration. SCDs for now. I reviewed pt's MRI images with pt's dtr at her request. Permission given by patient to allow dtr to look at his images. 03-01-2023 remains on IV ABX with cefepime/vanco. Awaiting disc aspiration vs bone biopsy tomorrow. Npo after MN. Continue with prn IV dilaudid/po dilaudid. Pt already has prn robaxin ordered. Start bowel program to prevent opioid-induced constipation. 03-02-2023 no window for bone culture or aspiration. Pt with carotid artery in the way of any possible biopsy pathway.  IR not going to proceed with any procedure. ID aware. Continue with po robaxin as needed.  03-03-2023 Pt seen by ID. OPAT orders written. PICC placed. Pt to go home today on Rocephin and Daptomycin for 6 weeks. Home with prn dilaudid and robaxin. Discussed with pt's dtr Elijah Pena by phone.  Tobacco abuse 02-28-2023 pt advised to quit. Rx nicotine patch and gum. 03-01-2023 stable  HLD (hyperlipidemia) 02-28-2023 stable 03-01-2023 stable  03-03-2023 hold statin while on daptomycin.    Discharge Diagnoses:  Principal Problem:   Cervical discitis Active Problems:   HLD (hyperlipidemia)   Tobacco abuse   Discharge Instructions  Discharge Instructions     [facility] pharmacist to adjust dose for Vancomycin,  Aminoglycosides and other anti-infective therapies as requested by physician.   Complete by: As directed    [facility] to provide Cath Flo 2mg    Complete by: As directed    Administer for PICC line occlusion and as ordered by physician for other access device issues.   Anaphylaxis Kit: Provided to treat any anaphylactic reaction to the medication being provided to the patient if First Dose or when requested by physician   Complete by: As directed    Epinephrine 1mg /ml vial / amp: Administer 0.3mg  (0.61ml) subcutaneously once for moderate to severe anaphylaxis, nurse to call physician and pharmacy when reaction occurs and call 911 if needed for immediate care   Diphenhydramine 50mg /ml IV vial: Administer 25-50mg  IV/IM PRN for first dose reaction, rash, itching, mild reaction, nurse to call physician and pharmacy when reaction occurs   Sodium Chloride 0.9% NS IV: Administer if needed for hypovolemic blood pressure drop or as ordered by physician after call to physician with anaphylactic reaction   Call MD for:  difficulty  breathing, headache or visual disturbances   Complete by: As directed    Call MD for:  extreme fatigue   Complete by: As directed    Call MD for:  hives   Complete by: As directed    Call MD for:  persistant dizziness or light-headedness   Complete by: As directed    Call MD for:  persistant nausea and vomiting   Complete by: As directed    Call MD for:  redness, tenderness, or signs of infection (pain, swelling, redness, odor or green/yellow discharge around incision site)   Complete by: As directed    Call MD for:  severe uncontrolled pain   Complete by: As directed    Call MD for:  temperature >100.4   Complete by: As directed    Change dressing on IV access line weekly and PRN   Complete by: As directed    Diet - low sodium heart healthy   Complete by: As directed    Discharge instructions   Complete by: As directed    1. Follow up with primary  care provider in 1-2 weeks after discharge 2. Follow up with Infectious Disease as scheduled 3. Follow up [facility] as scheduled   Flush IV access with Sodium Chloride 0.9% and Heparin 10 units/ml or 100 units/ml   Complete by: As directed    Home infusion instructions - [facility]   Complete by: As directed    Instructions: Flush IV access with Sodium Chloride 0.9% and Heparin 10units/ml or 100units/ml   Change dressing on IV access line: Weekly and PRN   Instructions Cath Flo 2mg : Administer for PICC Line occlusion and as ordered by physician for other access device   [facility] pharmacist to adjust dose for: Vancomycin, Aminoglycosides and other anti-infective therapies as requested by physician   Increase activity slowly   Complete by: As directed    Method of administration may be changed at the discretion of home infusion pharmacist based upon assessment of the patient and/or caregiver's ability to self-administer the medication ordered   Complete by: As directed    No wound care   Complete by: As directed       Allergies as of 03/03/2023       Reactions   Wellbutrin [bupropion] Anaphylaxis   Nicoderm [nicotine] Rash        Medication List     PAUSE taking these medications    rosuvastatin 10 MG tablet Wait to take this until your doctor or other care provider tells you to start again. Commonly known as: CRESTOR Take 10 mg by mouth at bedtime.       TAKE these medications    albuterol 108 (90 Base) MCG/ACT inhaler Commonly known as: VENTOLIN HFA Inhale 2 puffs into the lungs every 4 (four) hours as needed for wheezing or shortness of breath.   cefTRIAXone IVPB Commonly known as: ROCEPHIN Inject 2 g into the vein daily. Indication:  Osteomyelitis First Dose: Yes Last Day of Therapy:  04/09/23 Labs - Once weekly:  CBC/D and BMP, Labs - Once weekly: ESR and CRP Method of administration: IV Push Method of administration  may be changed at the discretion of home infusion pharmacist based upon assessment of the patient and/or caregiver's ability to self-administer the medication ordered.   daptomycin IVPB Commonly known as: CUBICIN Inject 500 mg into the vein daily. Indication:  Osteomyelitis First Dose: Yes Last Day of Therapy:  04/09/23 Labs - Once weekly:  CBC/D, BMP, and CPK Labs - Once weekly: ESR and CRP Method of administration: IV Push Method of administration may be changed at the discretion of home infusion pharmacist based upon assessment of the patient and/or caregiver's ability to self-administer the medication ordered.   HYDROmorphone 2 MG tablet Commonly known as: DILAUDID Take 1 tablet (2 mg total) by mouth every 4 (four) hours as needed for up to 5 days for moderate pain (pain score 4-6).   methocarbamol 750 MG tablet Commonly known as: ROBAXIN Take 1 tablet (750 mg total) by mouth every 6 (six) hours as needed for muscle spasms.               Discharge Care Instructions  (From admission, onward)           Start     Ordered   03/02/23 0000  Change dressing on IV access line weekly and PRN  (Home infusion instructions - [facility] )        03/02/23 1502            Follow-up Information     [facility] Follow up.   Why: Julianne Rice will provide home health services and delivery of IV antibiotics for the home.               Allergies  Allergen Reactions   Wellbutrin [Bupropion] Anaphylaxis   Nicoderm [Nicotine] Rash    Discharge Exam: Vitals:   03/03/23 0512 03/03/23 0819  BP: (!) 127/58 (!) 149/74  Pulse: 81 76  Resp: 18 16  Temp: 98.6 F (37 C) 98.1 F (36.7 C)  SpO2: 96% 97%    Physical Exam Vitals and nursing note reviewed.  Constitutional:      General: He is not in acute distress.    Appearance: He is not toxic-appearing or diaphoretic.  HENT:     Head: Normocephalic and atraumatic.  Eyes:     General: No scleral  icterus. Abdominal:     General: There is no distension.  Musculoskeletal:     Right lower leg: No edema.     Left lower leg: No edema.  Skin:    General: Skin is warm and dry.     Capillary Refill: Capillary refill takes less than 2 seconds.  Neurological:     General: No focal deficit present.     Mental Status: He is alert.     Gait: Gait normal.     The results of significant diagnostics from this hospitalization (including imaging, microbiology, ancillary and laboratory) are listed below for reference.    Microbiology: Recent Results (from the past 240 hours)  Blood culture (routine x 2)     Status: None (Preliminary result)   Collection Time: 02/28/23  1:00 AM   Specimen: BLOOD  Result Value Ref Range Status   Specimen Description BLOOD RIGHT ANTECUBITAL  Final   Special Requests   Final    BOTTLES DRAWN AEROBIC AND ANAEROBIC Blood Culture results may not be optimal due to an inadequate volume of blood received in culture bottles   Culture   Final    NO GROWTH 3 DAYS Performed at [facility], [address], [place], [place] [address]    Report Status PENDING  Incomplete  Blood culture (routine x 2)     Status: None (Preliminary result)   Collection Time: 02/28/23  1:00 AM   Specimen: BLOOD  Result Value Ref Range Status   Specimen Description BLOOD BLOOD RIGHT FOREARM  Final  Special Requests   Final    BOTTLES DRAWN AEROBIC AND ANAEROBIC Blood Culture adequate volume   Culture   Final    NO GROWTH 3 DAYS Performed at [facility], [address] [place], [place] [address]    Report Status PENDING  Incomplete     Labs: Basic Metabolic Panel: Recent Labs  Lab 02/28/23 0100 02/28/23 0656 03/01/23 0715 03/02/23 0549 03/03/23 0845  NA 134* 135 135 135 136  K 3.9 4.2 4.0 3.9 3.8  CL 99 101 103 100 102  CO2 23 24 25 23 26   GLUCOSE 112* 132* 111* 117* 103*  BUN 17 17 11 9 11   CREATININE 0.95 0.88 0.86 0.73 0.71  CALCIUM 10.0 9.6 9.3 9.4 9.3    Liver Function Tests: Recent Labs  Lab 02/28/23 0100 02/28/23 0656  AST 18 17  ALT 19 17  ALKPHOS 81 75  BILITOT 0.8 0.7  PROT 7.3 6.4*  ALBUMIN 3.7 3.3*   CBC: Recent Labs  Lab 02/28/23 0100 02/28/23 0656 03/02/23 0549  WBC 13.5* 10.6* 10.3  NEUTROABS 9.9* 7.0 7.6  HGB 13.6 12.9* 12.9*  HCT 40.1 37.8* 38.3*  MCV 87.4 88.1 88.0  PLT 312 284 287   Cardiac Enzymes: Recent Labs  Lab 03/03/23 0845  CKTOTAL 46*   Sepsis Labs Recent Labs  Lab 02/28/23 0100 02/28/23 0656 03/02/23 0549  WBC 13.5* 10.6* 10.3   Microbiology Recent Results (from the past 240 hours)  Blood culture (routine x 2)     Status: None (Preliminary result)   Collection Time: 02/28/23  1:00 AM   Specimen: BLOOD  Result Value Ref Range Status   Specimen Description BLOOD RIGHT ANTECUBITAL  Final   Special Requests   Final    BOTTLES DRAWN AEROBIC AND ANAEROBIC Blood Culture results may not be optimal due to an inadequate volume of blood received in culture bottles   Culture   Final    NO GROWTH 3 DAYS Performed at [facility], [address] [place], [place] [address]    Report Status PENDING  Incomplete  Blood culture (routine x 2)     Status: None (Preliminary result)   Collection Time: 02/28/23  1:00 AM   Specimen: BLOOD  Result Value Ref Range Status   Specimen Description BLOOD BLOOD RIGHT FOREARM  Final   Special Requests   Final    BOTTLES DRAWN AEROBIC AND ANAEROBIC Blood Culture adequate volume   Culture   Final    NO GROWTH 3 DAYS Performed at [facility], [address], [place], [place] [address]    Report Status PENDING  Incomplete    Procedures/Studies: [place] EKG SITE RITE Result Date: 03/02/2023 If Site Rite image not attached, placement could not be confirmed due to current cardiac rhythm.  MR Cervical Spine Wo Contrast Result Date: 02/27/2023 CLINICAL DATA:  Neck pain, chronic, degenerative changes on xray. Right arm weakness EXAM: MRI CERVICAL SPINE  WITHOUT CONTRAST TECHNIQUE: Multiplanar, multisequence MR imaging of the cervical spine was performed. No intravenous contrast was administered. COMPARISON:  None Available. FINDINGS: Technical Note: Despite efforts by the technologist and patient, motion artifact is present on today's exam and could not be eliminated. This reduces exam sensitivity and specificity. Alignment: Trace retrolisthesis at C5-6. Vertebrae: Bone marrow edema throughout the C6 and C7 vertebral bodies with confluent low T1 marrow signal changes. Marrow edema extends to the right C7 pedicle. Patchy bone marrow edema is also present within the T1  vertebral body anteriorly. No fluid signal within the intervertebral discs. Subchondral marrow edema associated with the right C7-T1 facet joint. There is height loss of the C7 vertebral body, age indeterminate. Cord: No convincing site of cervical cord signal abnormality on motion degraded images. Posterior Fossa, vertebral arteries, paraspinal tissues: T2/STIR hyperintense signal in the prevertebral soft tissues anterior to the lower cervical spine centered at C7, more pronounced on the right (series 7, image 3). Disc levels: C2-C3: No significant disc protrusion, foraminal stenosis, or canal stenosis. C3-C4: Disc osteophyte complex with left greater than right facet and uncovertebral arthropathy. Moderate-severe left and mild right foraminal stenosis. No canal stenosis. C4-C5: Disc osteophyte complex with facet and uncovertebral arthropathy. Moderate bilateral foraminal stenosis, right worse than left. No canal stenosis. C5-C6: Disc osteophyte complex with bilateral facet and uncovertebral arthropathy. Moderate canal stenosis with severe bilateral foraminal stenosis. C6-C7: Disc osteophyte complex with facet and uncovertebral arthropathy. Mild canal stenosis with severe bilateral foraminal stenosis. C7-T1: No disc protrusion. Bilateral facet arthropathy. Borderline-mild canal stenosis. Moderate  bilateral foraminal stenosis, worse on the right. IMPRESSION: 1. Bone marrow edema throughout the C6 and C7 vertebral bodies with confluent low T1 marrow signal changes. Patchy bone marrow edema is also present within the T1 vertebral body anteriorly. 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 Septic arthritis of the right C7-T1 facet joint is also a possibility. Correlation with ESR and CRP is recommended. In the absence of signs of systemic infection/inflammation, a infiltrative marrow replacing neoplasm should be considered. 2. Prevertebral soft tissue edema anterior to the lower cervical spine centered at C7, more pronounced on the right, favoring prevertebral phlegmon. 3. Multilevel cervical spondylosis, most pronounced at C5-6 where there is moderate canal stenosis and severe bilateral foraminal stenosis. 4. Moderate-severe left foraminal stenosis at C3-4. Moderate bilateral foraminal stenosis at C4-5 and C7-T1. Electronically Signed   By: Duanne Guess D.O.   On: 02/27/2023 20:37    Time coordinating discharge: 45 mins  SIGNED:  Carollee Herter, DO [facility] 03/03/23, 2:53 PM

## 2023-03-03 NOTE — Plan of Care (Signed)

## 2023-03-03 NOTE — Progress Notes (Signed)
PROGRESS NOTE    Elijah Pena  ZDG:[id number] DOB: [birth year] DOA: 02/27/2023 PCP: Alois Cliche, MD  Subjective: Pt seen and examined. Pt seen by ID OPAT orders written. PICC placed Pt to go home today on Rocephin and Daptomycin for 6 weeks.   Hospital Course: HPI: Elijah Pena is a 63 y.o. male with history of tobacco abuse had a fall in November 2024 since then has been hurting in his neck.  Pain has been gradually worsening radiating to both his arms.  About 10 days ago patient had followed up with spine surgeons in [facility].  Plan was to get MRI of the C-spine but due to worsening pain patient presents to the ER.  Denies any incontinence of urine or bowel.  Denies any fever chills.   ED Course: In the ER patient appears nonfocal.  MRI of the C-spine shows concerning features for discitis/osteomyelitis involving the C6-C7 and C7 and T1 areas and also prevertebral phlegmon involving the C7 area.  On-call neurosurgery was consulted and requested medical management at this time.  Blood cultures obtained patient was started on vancomycin and cefepime by the ER physician.  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 Patchy bone marrow edema is also present within the T1 vertebral body anteriorly. 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 Septic arthritis of the right C7-T1 facet joint is also a possibility. Correlation with ESR and CRP is recommended. In the absence of signs of systemic infection/inflammation, a infiltrative marrow replacing neoplasm should be considered. 2. Prevertebral soft tissue edema anterior to the lower cervical spine centered at C7, more pronounced on the right, favoring prevertebral phlegmon. 3. Multilevel  cervical spondylosis, most pronounced at C5-6 where there is moderate canal stenosis and severe bilateral foraminal stenosis. 4. Moderate-severe left foraminal stenosis at C3-4. Moderate bilateral foraminal stenosis at C4-5 and C7-T1.  Antibiotic Therapy: Anti-infectives (From admission, onward)    Start     Dose/Rate Route Frequency Ordered Stop   03/02/23 1600  cefTRIAXone (ROCEPHIN) 2 g in sodium chloride 0.9 % 100 mL IVPB        2 g 200 mL/hr over 30 Minutes Intravenous Every 24 hours 03/02/23 1021     03/02/23 1130  DAPTOmycin (CUBICIN) IVPB 500 mg/72mL premix        8 mg/kg  60.3 kg 100 mL/hr over 30 Minutes Intravenous Daily 03/02/23 1021     03/01/23 0030  vancomycin (VANCOCIN) 750 mg in sodium chloride 0.9 % 250 mL IVPB  Status:  Discontinued        750 mg 250 mL/hr over 60 Minutes Intravenous Every 12 hours 02/28/23 2026 03/02/23 1021   02/28/23 1230  vancomycin (VANCOCIN) 750 mg in sodium chloride 0.9 % 250 mL IVPB  Status:  Discontinued        750 mg 265 mL/hr over 60 Minutes Intravenous Every 12 hours 02/28/23 0757 02/28/23 2025   02/28/23 1200  vancomycin (VANCOREADY) IVPB 750 mg/150 mL  Status:  Discontinued        750 mg 150 mL/hr over 60 Minutes Intravenous Every 12 hours 02/28/23 0747 02/28/23 0757   02/28/23 0800  ceFEPIme (MAXIPIME) 2 g in sodium chloride 0.9 % 100 mL IVPB  Status:  Discontinued        2  g 200 mL/hr over 30 Minutes Intravenous Every 8 hours 02/28/23 0747 03/02/23 1021   02/28/23 0045  vancomycin (VANCOREADY) IVPB 1250 mg/250 mL        1,250 mg 166.7 mL/hr over 90 Minutes Intravenous  Once 02/28/23 0036 02/28/23 0330   02/28/23 0045  ceFEPIme (MAXIPIME) 2 g in sodium chloride 0.9 % 100 mL IVPB        2 g 200 mL/hr over 30 Minutes Intravenous  Once 02/28/23 0036 02/28/23 0153       Procedures:   Consultants: ID Neurology IR    Assessment and Plan: * Cervical discitis 02-28-2023 continue with cefepime/vanco. ID consulted for abx  management. IR and neurosurgery consulted. Will rx IV and po dilaudid prn for pain. Pt and dtr warned about addictive potential of dilaudid. Pt states that norco and oxycodone have not helped with his pain. Hold on any chemical DVT prophylaxis until he gets his biopsy/disc aspiration. SCDs for now. I reviewed pt's MRI images with pt's dtr at her request. Permission given by patient to allow dtr to look at his images. 03-01-2023 remains on IV ABX with cefepime/vanco. Awaiting disc aspiration vs bone biopsy tomorrow. Npo after MN. Continue with prn IV dilaudid/po dilaudid. Pt already has prn robaxin ordered. Start bowel program to prevent opioid-induced constipation. 03-02-2023 no window for bone culture or aspiration. Pt with carotid artery in the way of any possible biopsy pathway.  IR not going to proceed with any procedure. ID aware. Continue with po robaxin as needed.  03-03-2023 Pt seen by ID. OPAT orders written. PICC placed. Pt to go home today on Rocephin and Daptomycin for 6 weeks. Home with prn dilaudid and robaxin. Discussed with pt's dtr Lawanna Kobus by phone.  Tobacco abuse 02-28-2023 pt advised to quit. Rx nicotine patch and gum. 03-01-2023 stable  HLD (hyperlipidemia) 02-28-2023 stable 03-01-2023 stable  03-03-2023 hold statin while on daptomycin.   DVT prophylaxis: heparin injection 5,000 Units Start: 03/02/23 1415 SCDs Start: 02/28/23 1610    Code Status: Full Code Family Communication: discussed with pt's dtr angel by phone Disposition Plan: return home Reason for continuing need for hospitalization: stable for DC.  Objective: Vitals:   03/02/23 1719 03/03/23 0339 03/03/23 0512 03/03/23 0819  BP: 137/84 (!) 148/72 (!) 127/58 (!) 149/74  Pulse: 84 94 81 76  Resp: 17 19 18 16   Temp:  98.9 F (37.2 C) 98.6 F (37 C) 98.1 F (36.7 C)  TempSrc:  Oral Oral Oral  SpO2: 100% 98% 96% 97%  Weight:      Height:        Intake/Output Summary (Last 24 hours) at 03/03/2023  1448 Last data filed at 03/03/2023 0651 Gross per 24 hour  Intake 404.22 ml  Output 1200 ml  Net -795.78 ml   Filed Weights   02/27/23 1818  Weight: 60.3 kg    Examination:  Physical Exam Vitals and nursing note reviewed.  Constitutional:      General: He is not in acute distress.    Appearance: He is not toxic-appearing or diaphoretic.  HENT:     Head: Normocephalic and atraumatic.  Eyes:     General: No scleral icterus. Abdominal:     General: There is no distension.  Musculoskeletal:     Right lower leg: No edema.     Left lower leg: No edema.  Skin:    General: Skin is warm and dry.     Capillary Refill: Capillary refill takes less than 2 seconds.  Neurological:     General: No focal deficit present.     Mental Status: He is alert.     Gait: Gait normal.     Data Reviewed: I have personally reviewed following labs and imaging studies  CBC: Recent Labs  Lab 02/28/23 0100 02/28/23 0656 03/02/23 0549  WBC 13.5* 10.6* 10.3  NEUTROABS 9.9* 7.0 7.6  HGB 13.6 12.9* 12.9*  HCT 40.1 37.8* 38.3*  MCV 87.4 88.1 88.0  PLT 312 284 287   Basic Metabolic Panel: Recent Labs  Lab 02/28/23 0100 02/28/23 0656 03/01/23 0715 03/02/23 0549 03/03/23 0845  NA 134* 135 135 135 136  K 3.9 4.2 4.0 3.9 3.8  CL 99 101 103 100 102  CO2 23 24 25 23 26   GLUCOSE 112* 132* 111* 117* 103*  BUN 17 17 11 9 11   CREATININE 0.95 0.88 0.86 0.73 0.71  CALCIUM 10.0 9.6 9.3 9.4 9.3   GFR: Estimated Creatinine Clearance: 79.6 mL/min (by C-G formula based on SCr of 0.71 mg/dL). Liver Function Tests: Recent Labs  Lab 02/28/23 0100 02/28/23 0656  AST 18 17  ALT 19 17  ALKPHOS 81 75  BILITOT 0.8 0.7  PROT 7.3 6.4*  ALBUMIN 3.7 3.3*   Coagulation Profile: Recent Labs  Lab 03/02/23 0549  INR 1.1   Cardiac Enzymes: Recent Labs  Lab 03/03/23 0845  CKTOTAL 46*   Recent Results (from the past 240 hours)  Blood culture (routine x 2)     Status: None (Preliminary result)    Collection Time: 02/28/23  1:00 AM   Specimen: BLOOD  Result Value Ref Range Status   Specimen Description BLOOD RIGHT ANTECUBITAL  Final   Special Requests   Final    BOTTLES DRAWN AEROBIC AND ANAEROBIC Blood Culture results may not be optimal due to an inadequate volume of blood received in culture bottles   Culture   Final    NO GROWTH 3 DAYS Performed at [facility], [address], [place], [place] [address]    Report Status PENDING  Incomplete  Blood culture (routine x 2)     Status: None (Preliminary result)   Collection Time: 02/28/23  1:00 AM   Specimen: BLOOD  Result Value Ref Range Status   Specimen Description BLOOD BLOOD RIGHT FOREARM  Final   Special Requests   Final    BOTTLES DRAWN AEROBIC AND ANAEROBIC Blood Culture adequate volume   Culture   Final    NO GROWTH 3 DAYS Performed at [facility], [address], [place], [place] [address]    Report Status PENDING  Incomplete    Radiology Studies: [place] EKG SITE RITE Result Date: 03/02/2023 If Site Rite image not attached, placement could not be confirmed due to current cardiac rhythm.   Scheduled Meds:  Chlorhexidine Gluconate Cloth  6 each Topical Daily   docusate sodium  200 mg Oral BID   heparin injection (subcutaneous)  5,000 Units Subcutaneous Q8H   nicotine  14 mg Transdermal Daily   sodium chloride flush  10-40 mL Intracatheter Q12H   Continuous Infusions:  cefTRIAXone (ROCEPHIN)  IV Stopped (03/02/23 1752)   DAPTOmycin 500 mg (03/03/23 1428)     LOS: 3 days   Time spent: 40 minutes  Carollee Herter, DO  [facility]  03/03/2023, 2:48 PM

## 2023-03-03 NOTE — TOC Progression Note (Signed)
Transition of Care [facility]) - Progression Note    Patient Details  Name: Elijah Pena MRN: [id number] Date of Birth: Aug 14, 1960  Transition of Care [facility]) CM/SW Contact  Janae Bridgeman, RN Phone Number: 03/03/2023, 12:10 PM  Clinical Narrative:    CM met with the patient at the bedside to discuss TOC needs with family at the bedside.  Jeri Modena, RNCM with [employer] plan to provide teaching at the bedside.  Patient is set up for home health RN through [employer].  Patient will be able to discharge home with medically stable for discharge.  The patient's daughter asked about FMLA and disability paperwork completion - and I directed her to follow up with the Spine Surgery office since hospital does not complete this paperwork.  Daughter is aware.  Patient should discharge today when medically stable.        Expected Discharge Plan and Services                                               Social Determinants of Health (SDOH) Interventions SDOH Screenings   Food Insecurity: No Food Insecurity (02/28/2023)  Housing: Low Risk  (03/01/2023)  Transportation Needs: No Transportation Needs (02/28/2023)  Utilities: Not At Risk (02/28/2023)  Tobacco Use: High Risk (02/28/2023)    Readmission Risk Interventions    03/02/2023    3:18 PM 03/02/2023    3:16 PM  Readmission Risk Prevention Plan  Post Dischage Appt Complete Complete  Medication Screening Complete Complete  Transportation Screening Complete Complete

## 2023-03-05 LAB — CULTURE, BLOOD (ROUTINE X 2)
Culture: NO GROWTH
Culture: NO GROWTH
Special Requests: ADEQUATE

## 2023-03-10 ENCOUNTER — Encounter: Payer: Self-pay | Admitting: Orthopaedic Surgery

## 2023-03-15 ENCOUNTER — Other Ambulatory Visit: Payer: BLUE CROSS/BLUE SHIELD

## 2023-03-20 ENCOUNTER — Telehealth: Payer: Self-pay

## 2023-03-20 ENCOUNTER — Ambulatory Visit: Payer: BLUE CROSS/BLUE SHIELD | Admitting: Internal Medicine

## 2023-03-20 NOTE — Telephone Encounter (Signed)
 Pt's daughter or daughter-in-law call to cancel pt's appt for this morning. Stated that he is currently at the ED of [facility] getting a full work up. She wanted us  to notify the clinical staff, for Dr. Efrain has been in coordination with other provider's and caring on the ID side.   Stated that he was not doing well and unsure if the infection was clearing, but noticed he developed a rash all across his back. She stated that they will be in contact as far as rescheduling a f/u and cancelled just at this time.

## 2023-04-06 ENCOUNTER — Emergency Department (HOSPITAL_COMMUNITY): Payer: BLUE CROSS/BLUE SHIELD

## 2023-04-06 ENCOUNTER — Emergency Department (HOSPITAL_COMMUNITY)
Admission: EM | Admit: 2023-04-06 | Discharge: 2023-04-06 | Disposition: A | Discharge status: Home / Self Care | Payer: BLUE CROSS/BLUE SHIELD | Attending: Emergency Medicine | Admitting: Emergency Medicine

## 2023-04-06 ENCOUNTER — Other Ambulatory Visit: Payer: Self-pay

## 2023-04-06 DIAGNOSIS — C3490 Malignant neoplasm of unspecified part of unspecified bronchus or lung: Secondary | ICD-10-CM | POA: Diagnosis not present

## 2023-04-06 DIAGNOSIS — F17200 Nicotine dependence, unspecified, uncomplicated: Secondary | ICD-10-CM | POA: Insufficient documentation

## 2023-04-06 DIAGNOSIS — G893 Neoplasm related pain (acute) (chronic): Secondary | ICD-10-CM | POA: Insufficient documentation

## 2023-04-06 DIAGNOSIS — M791 Myalgia, unspecified site: Secondary | ICD-10-CM | POA: Diagnosis present

## 2023-04-06 LAB — URINALYSIS, ROUTINE W REFLEX MICROSCOPIC
Bacteria, UA: NONE SEEN
Bilirubin Urine: NEGATIVE
Glucose, UA: NEGATIVE mg/dL
Ketones, ur: NEGATIVE mg/dL
Leukocytes,Ua: NEGATIVE
Nitrite: NEGATIVE
Protein, ur: NEGATIVE mg/dL
Specific Gravity, Urine: 1.012 (ref 1.005–1.030)
pH: 7 (ref 5.0–8.0)

## 2023-04-06 LAB — CBC WITH DIFFERENTIAL/PLATELET
Abs Immature Granulocytes: 0.32 10*3/uL — ABNORMAL HIGH (ref 0.00–0.07)
Basophils Absolute: 0.1 10*3/uL (ref 0.0–0.1)
Basophils Relative: 0 %
Eosinophils Absolute: 0 10*3/uL (ref 0.0–0.5)
Eosinophils Relative: 0 %
HCT: 41 % (ref 39.0–52.0)
Hemoglobin: 14 g/dL (ref 13.0–17.0)
Immature Granulocytes: 1 %
Lymphocytes Relative: 5 %
Lymphs Abs: 1.4 10*3/uL (ref 0.7–4.0)
MCH: 29.9 pg (ref 26.0–34.0)
MCHC: 34.1 g/dL (ref 30.0–36.0)
MCV: 87.4 fL (ref 80.0–100.0)
Monocytes Absolute: 2.3 10*3/uL — ABNORMAL HIGH (ref 0.1–1.0)
Monocytes Relative: 8 %
Neutro Abs: 24.8 10*3/uL — ABNORMAL HIGH (ref 1.7–7.7)
Neutrophils Relative %: 86 %
Platelets: 213 10*3/uL (ref 150–400)
RBC: 4.69 MIL/uL (ref 4.22–5.81)
RDW: 12.8 % (ref 11.5–15.5)
WBC: 28.9 10*3/uL — ABNORMAL HIGH (ref 4.0–10.5)
nRBC: 0 % (ref 0.0–0.2)

## 2023-04-06 LAB — COMPREHENSIVE METABOLIC PANEL
ALT: 42 U/L (ref 0–44)
AST: 31 U/L (ref 15–41)
Albumin: 3.5 g/dL (ref 3.5–5.0)
Alkaline Phosphatase: 64 U/L (ref 38–126)
Anion gap: 14 (ref 5–15)
BUN: 22 mg/dL (ref 8–23)
CO2: 22 mmol/L (ref 22–32)
Calcium: 9.8 mg/dL (ref 8.9–10.3)
Chloride: 96 mmol/L — ABNORMAL LOW (ref 98–111)
Creatinine, Ser: 0.75 mg/dL (ref 0.61–1.24)
GFR, Estimated: >60 mL/min (ref >60)
Glucose, Bld: 95 mg/dL (ref 70–99)
Potassium: 3.9 mmol/L (ref 3.5–5.1)
Sodium: 132 mmol/L — ABNORMAL LOW (ref 135–145)
Total Bilirubin: 1.4 mg/dL — ABNORMAL HIGH (ref 0.0–1.2)
Total Protein: 6.7 g/dL (ref 6.5–8.1)

## 2023-04-06 LAB — RESP PANEL BY RT-PCR (RSV, FLU A&B, COVID)  RVPGX2
Influenza A by PCR: NEGATIVE
Influenza B by PCR: NEGATIVE
Resp Syncytial Virus by PCR: NEGATIVE
SARS Coronavirus 2 by RT PCR: NEGATIVE

## 2023-04-06 MED ORDER — HYDROMORPHONE HCL 1 MG/ML IJ SOLN
1.0000 mg | Freq: Once | INTRAMUSCULAR | Status: AC
  Administered 2023-04-06: 1 mg via INTRAVENOUS
  Filled 2023-04-06: qty 1

## 2023-04-06 MED ORDER — SODIUM CHLORIDE 0.9 % IV BOLUS
1000.0000 mL | Freq: Once | INTRAVENOUS | Status: AC
  Administered 2023-04-06: 1000 mL via INTRAVENOUS

## 2023-04-06 NOTE — ED Triage Notes (Signed)
 Pt bib ems from c/o uncontrolled pain. Pt has advance lung and prostate cancer. Pt has increased generalized pain. Pt was suppose to have colonoscopy today but reschedule d/t not taking prep. Pt is having a hard time controlling pain.   BP 170/90 HR 82 RR 22 O2 97

## 2023-04-06 NOTE — ED Provider Notes (Signed)
 Care of patient assumed from PA [place].  This patient presents with acute on chronic diffuse pain. Physical Exam  BP (!) 178/93   Pulse 74   Temp 97.9 F (36.6 C)   Resp 19   Ht 5\' 7"  (1.702 m)   Wt 59 kg   SpO2 97%   BMI 20.36 kg/m   Physical Exam Vitals and nursing note reviewed.  Constitutional:      General: He is not in acute distress.    Appearance: He is well-developed and underweight. He is not ill-appearing, toxic-appearing or diaphoretic.  HENT:     Head: Normocephalic and atraumatic.     Right Ear: External ear normal.     Left Ear: External ear normal.     Nose: Nose normal.     Mouth/Throat:     Mouth: Mucous membranes are moist.  Eyes:     Extraocular Movements: Extraocular movements intact.     Conjunctiva/sclera: Conjunctivae normal.  Cardiovascular:     Rate and Rhythm: Normal rate and regular rhythm.  Pulmonary:     Effort: Pulmonary effort is normal. No respiratory distress.  Abdominal:     General: There is no distension.     Palpations: Abdomen is soft.  Musculoskeletal:        General: No swelling. Normal range of motion.     Cervical back: Normal range of motion and neck supple.  Skin:    General: Skin is warm and dry.     Coloration: Skin is not jaundiced or pale.  Neurological:     General: No focal deficit present.     Mental Status: He is alert and oriented to person, place, and time.  Psychiatric:        Mood and Affect: Mood normal.        Behavior: Behavior normal.     Procedures  Procedures  ED Course / MDM   Clinical Course as of 04/06/23 1750  Mon Apr 06, 2023  1512 12/28/22 diagnosis.  [CG]    Clinical Course User Index [CG] Al Decant, PA-C   Medical Decision Making Amount and/or Complexity of Data Reviewed Labs: ordered. Radiology: ordered.  Risk Prescription drug management.   Per review, he is seen by [facility].  He underwent recent PET scan which showed right upper lung lobe lesions possibly  reflective of a primary pulmonary neoplasm.  He had hypermetabolic hilar, mediastinal, and supraclavicular lymph nodes.  He had multiple hypermetabolic osseous lesions concerning for metastatic disease.  Locations of these osseous lesions are C5-T1 spine, right scapula, bilateral ribs, right iliac crest, and right femoral head.  On MRI 3 weeks ago, he did have epidural spread of disease contributing to moderate to severe spinal canal stenosis from C5-C7.  Current home medications include Decadron, Eliquis, Naprosyn, Lyrica, on assessment today, patient appears to be resting comfortably.  He denies any significant ongoing pain.  He states that he lives with pain secondary to his metastatic disease.  He was able to stand and urinate without assistance.  He states that he has adequate pain control medications at home.  I spoke with his son, Mr. Ryver Poblete, over the phone.  The patient has been living with his son Ivin Booty since his cancer diagnosis.  Ivin Booty states that the patient typically does well on his own.  When the family is awake, he will voice increased needs and increased complaints about his pain.  He has been taken off of his antibiotics that were started last  month due to concern of an osteomyelitis.  Subsequent imaging has deemed these bone lesions to be metastatic in nature.  The patient's daughter is a Designer, jewellery.  Ivin Booty was offered face-to-face for social work to reach out for increased home health needs.  He declined this at this time.  He states that his brother is on the way to the ED to pick up the patient.  Patient was discharged in stable condition.       Gloris Manchester, MD 04/06/23 [phone]

## 2023-04-06 NOTE — ED Provider Triage Note (Signed)
 Emergency Medicine Provider Triage Evaluation Note  Elijah Pena , a 63 y.o. male  was evaluated in triage.  Pt complains of worsening chronic pain.  On Dilaudid at home.  No new medical complaints.  Review of Systems  Positive:  Negative:   Physical Exam  BP (!) 167/96 (BP Location: Left Arm)   Pulse (!) 105   Temp 98.1 F (36.7 C) (Oral)   Resp 20   Ht 1.702 m (5\' 7" )   Wt 59 kg   SpO2 100%   BMI 20.36 kg/m  Gen:   Awake, no distress   Resp:  Normal effort  MSK:   Moves extremities without difficulty  Other:    Medical Decision Making  Medically screening exam initiated at 11:47 AM.  Appropriate orders placed.  [name] was informed that the remainder of the evaluation will be completed by another provider, this initial triage assessment does not replace that evaluation, and the importance of remaining in the ED until their evaluation is complete.     Lorre Nick, MD 04/06/23 1147

## 2023-04-06 NOTE — ED Notes (Signed)
 Pt says he take pain medication at home but it hasn't been working.

## 2023-04-06 NOTE — Discharge Instructions (Signed)
 Continue your home medications as prescribed.  Continue your outpatient appointments as scheduled.  Return to the emergency department for any new or worsening symptoms of concern.

## 2023-04-06 NOTE — ED Provider Notes (Signed)
 [facility] Provider Note   CSN: [id number] Arrival date & time: 04/06/23  1059     History  Chief Complaint  Patient presents with   Generalized Body Aches    RAE SUTCLIFFE is a 63 y.o. male with medical history of appendectomy, cervical discitis, tobacco use disorder, lung cancer currently on chemotherapy.  Patient presents to ED for evaluation of generalized bodyaches.  Reports that his pain is very significant.  Reports that he is on numerous pain medications to include p.o. Dilaudid at home, gabapentin, hydrocodone's however none of these are alleviating pain.  Reports he was set to have colonoscopy today but had to cancel due to pain.  Denies nausea, vomiting, diarrhea, abdominal pain at home.  Denies fevers, dysuria, chest pain, shortness of breath.  HPI     Home Medications Prior to Admission medications   Medication Sig Start Date End Date Taking? Authorizing Provider  albuterol (VENTOLIN HFA) 108 (90 Base) MCG/ACT inhaler Inhale 2 puffs into the lungs every 4 (four) hours as needed for wheezing or shortness of breath. 12/23/22   Roxy Horseman, PA-C  rosuvastatin (CRESTOR) 10 MG tablet Take 10 mg by mouth at bedtime. Patient not taking: Reported on 02/28/2023 02/24/23   [provider]      Allergies    Wellbutrin [bupropion] and Nicoderm [nicotine]    Review of Systems   Review of Systems  Physical Exam Updated Vital Signs BP (!) 175/102   Pulse 86   Temp 97.9 F (36.6 C)   Resp 20   Ht 5\' 7"  (1.702 m)   Wt 59 kg   SpO2 95%   BMI 20.36 kg/m  Physical Exam  ED Results / Procedures / Treatments   Labs (all labs ordered are listed, but only abnormal results are displayed) Labs Reviewed  CBC WITH DIFFERENTIAL/PLATELET - Abnormal; Notable for the following components:      Result Value   WBC 28.9 (*)    Neutro Abs 24.8 (*)    Monocytes Absolute 2.3 (*)    Abs Immature Granulocytes 0.32 (*)    All  other components within normal limits  COMPREHENSIVE METABOLIC PANEL - Abnormal; Notable for the following components:   Sodium 132 (*)    Chloride 96 (*)    Total Bilirubin 1.4 (*)    All other components within normal limits  URINALYSIS, ROUTINE W REFLEX MICROSCOPIC - Abnormal; Notable for the following components:   APPearance CLOUDY (*)    Hgb urine dipstick MODERATE (*)    All other components within normal limits  RESP PANEL BY RT-PCR (RSV, FLU A&B, COVID)  RVPGX2    EKG None  Radiology No results found.  Procedures Procedures    Medications Ordered in ED Medications  HYDROmorphone (DILAUDID) injection 1 mg (1 mg Intravenous Given 04/06/23 1640)  sodium chloride 0.9 % bolus 1,000 mL (0 mLs Intravenous Stopped 04/06/23 1933)    ED Course/ Medical Decision Making/ A&P Clinical Course as of 04/23/23 1954  Mon Apr 06, 2023  1512 12/28/22 diagnosis.  [CG]    Clinical Course User Index [CG] Al Decant, PA-C   {    Medical Decision Making Amount and/or Complexity of Data Reviewed Labs: ordered. Radiology: ordered.  Risk Prescription drug management.   Patient here for pain. HPI and physical exam as above.  Patient handed off to Dr. Durwin Nora at end of shift, workup incomplete.   Final Clinical Impression(s) / ED Diagnoses Final diagnoses:  Chronic pain due to neoplasm    Rx / DC Orders ED Discharge Orders     None         Clent Ridges 04/23/23 1954    Lorre Nick, MD 04/24/23 [phone]

## 2023-05-12 DEATH — deceased
